# Patient Record
Sex: Female | Born: 2000 | Race: Black or African American | Hispanic: No | Marital: Single | Smoking: Never smoker
Health system: Southern US, Community
[De-identification: ages and names within clinical notes are randomized; demographics above are authoritative.]

## PROBLEM LIST (undated history)

## (undated) DIAGNOSIS — J45909 Unspecified asthma, uncomplicated: Secondary | ICD-10-CM

## (undated) HISTORY — PX: NO PAST SURGERIES: SHX2092

## (undated) HISTORY — DX: Unspecified asthma, uncomplicated: J45.909

---
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
## 2021-11-27 ENCOUNTER — Encounter (HOSPITAL_BASED_OUTPATIENT_CLINIC_OR_DEPARTMENT_OTHER): Payer: Self-pay

## 2021-11-27 ENCOUNTER — Emergency Department (HOSPITAL_BASED_OUTPATIENT_CLINIC_OR_DEPARTMENT_OTHER)
Admission: EM | Admit: 2021-11-27 | Discharge: 2021-11-27 | Disposition: A | Discharge status: Home / Self Care | Payer: BC Managed Care – PPO | Attending: Emergency Medicine | Admitting: Emergency Medicine

## 2021-11-27 ENCOUNTER — Other Ambulatory Visit: Payer: Self-pay

## 2021-11-27 DIAGNOSIS — N898 Other specified noninflammatory disorders of vagina: Secondary | ICD-10-CM | POA: Insufficient documentation

## 2021-11-27 DIAGNOSIS — N760 Acute vaginitis: Secondary | ICD-10-CM | POA: Diagnosis not present

## 2021-11-27 DIAGNOSIS — N3000 Acute cystitis without hematuria: Secondary | ICD-10-CM | POA: Diagnosis not present

## 2021-11-27 DIAGNOSIS — R1084 Generalized abdominal pain: Secondary | ICD-10-CM | POA: Insufficient documentation

## 2021-11-27 DIAGNOSIS — B9689 Other specified bacterial agents as the cause of diseases classified elsewhere: Secondary | ICD-10-CM | POA: Diagnosis not present

## 2021-11-27 DIAGNOSIS — R112 Nausea with vomiting, unspecified: Secondary | ICD-10-CM | POA: Insufficient documentation

## 2021-11-27 LAB — URINALYSIS, ROUTINE W REFLEX MICROSCOPIC
Bilirubin Urine: NEGATIVE
Glucose, UA: NEGATIVE mg/dL
Hgb urine dipstick: NEGATIVE
Ketones, ur: 15 mg/dL — AB
Nitrite: NEGATIVE
Protein, ur: 30 mg/dL — AB
Specific Gravity, Urine: 1.02 (ref 1.005–1.030)
pH: 8.5 — ABNORMAL HIGH (ref 5.0–8.0)

## 2021-11-27 LAB — CBC WITH DIFFERENTIAL/PLATELET
Abs Immature Granulocytes: 0.06 10*3/uL (ref 0.00–0.07)
Basophils Absolute: 0 10*3/uL (ref 0.0–0.1)
Basophils Relative: 0 %
Eosinophils Absolute: 0 10*3/uL (ref 0.0–0.5)
Eosinophils Relative: 0 %
HCT: 33.4 % — ABNORMAL LOW (ref 36.0–46.0)
Hemoglobin: 11 g/dL — ABNORMAL LOW (ref 12.0–15.0)
Immature Granulocytes: 1 %
Lymphocytes Relative: 9 %
Lymphs Abs: 0.7 10*3/uL (ref 0.7–4.0)
MCH: 29.9 pg (ref 26.0–34.0)
MCHC: 32.9 g/dL (ref 30.0–36.0)
MCV: 90.8 fL (ref 80.0–100.0)
Monocytes Absolute: 0.3 10*3/uL (ref 0.1–1.0)
Monocytes Relative: 4 %
Neutro Abs: 7.1 10*3/uL (ref 1.7–7.7)
Neutrophils Relative %: 86 %
Platelets: 612 10*3/uL — ABNORMAL HIGH (ref 150–400)
RBC: 3.68 MIL/uL — ABNORMAL LOW (ref 3.87–5.11)
RDW: 13.1 % (ref 11.5–15.5)
WBC: 8.2 10*3/uL (ref 4.0–10.5)
nRBC: 0 % (ref 0.0–0.2)

## 2021-11-27 LAB — COMPREHENSIVE METABOLIC PANEL
ALT: 22 U/L (ref 0–44)
AST: 29 U/L (ref 15–41)
Albumin: 3.9 g/dL (ref 3.5–5.0)
Alkaline Phosphatase: 49 U/L (ref 38–126)
Anion gap: 9 (ref 5–15)
BUN: 11 mg/dL (ref 6–20)
CO2: 27 mmol/L (ref 22–32)
Calcium: 9.4 mg/dL (ref 8.9–10.3)
Chloride: 102 mmol/L (ref 98–111)
Creatinine, Ser: 0.75 mg/dL (ref 0.44–1.00)
GFR, Estimated: >60 mL/min (ref >60)
Glucose, Bld: 115 mg/dL — ABNORMAL HIGH (ref 70–99)
Potassium: 3.8 mmol/L (ref 3.5–5.1)
Sodium: 138 mmol/L (ref 135–145)
Total Bilirubin: 0.4 mg/dL (ref 0.3–1.2)
Total Protein: 8.4 g/dL — ABNORMAL HIGH (ref 6.5–8.1)

## 2021-11-27 LAB — PREGNANCY, URINE: Preg Test, Ur: NEGATIVE

## 2021-11-27 LAB — WET PREP, GENITAL
Sperm: NONE SEEN
Trich, Wet Prep: NONE SEEN
WBC, Wet Prep HPF POC: <10 (ref <10)
Yeast Wet Prep HPF POC: NONE SEEN

## 2021-11-27 LAB — URINALYSIS, MICROSCOPIC (REFLEX)

## 2021-11-27 LAB — LIPASE, BLOOD: Lipase: 27 U/L (ref 11–51)

## 2021-11-27 MED ORDER — ONDANSETRON HCL 4 MG/2ML IJ SOLN
4.0000 mg | Freq: Once | INTRAMUSCULAR | Status: AC
  Administered 2021-11-27: 11:00:00 4 mg via INTRAVENOUS
  Filled 2021-11-27: qty 2

## 2021-11-27 MED ORDER — MORPHINE SULFATE (PF) 2 MG/ML IV SOLN
2.0000 mg | Freq: Once | INTRAVENOUS | Status: AC
  Administered 2021-11-27: 11:00:00 2 mg via INTRAVENOUS
  Filled 2021-11-27: qty 1

## 2021-11-27 MED ORDER — CEPHALEXIN 500 MG PO CAPS: 500.0000 mg | ORAL_CAPSULE | Freq: Two times a day (BID) | ORAL | 0 refills | Status: AC

## 2021-11-27 MED ORDER — METRONIDAZOLE 500 MG PO TABS: 500.0000 mg | ORAL_TABLET | Freq: Two times a day (BID) | ORAL | 0 refills | Status: DC

## 2021-11-27 MED ORDER — ONDANSETRON 4 MG PO TBDP: 4.0000 mg | ORAL_TABLET | Freq: Three times a day (TID) | ORAL | 0 refills | Status: DC | PRN

## 2021-11-27 MED ORDER — SODIUM CHLORIDE 0.9 % IV BOLUS
1000.0000 mL | Freq: Once | INTRAVENOUS | Status: AC
  Administered 2021-11-27: 11:00:00 1000 mL via INTRAVENOUS

## 2021-11-27 NOTE — ED Provider Notes (Signed)
[facility] EMERGENCY DEPARTMENT Provider Note   CSN: [id number] Arrival date & time: 11/27/21  [id number]     History Chief Complaint  Patient presents with   Abdominal Pain    Carla Andrews is a 20 y.o. female who presents to the ED today with complaint of gradual onset, constant, right upper quadrant abdominal pain radiating into epigastrium for the past 2 weeks.  She states that last night he began vomiting which was new for her.  She states she has multiple episodes of emesis.  She states the pain is not radiating into her epigastric area.  She last had a bowel movement a couple of days ago however states that before that she had a bowel movement about a week ago which is atypical for her to go so long.  She denies any recent sick contacts.  She denies any suspicious food intake.  She has never had this pain in the past.  She also complains of vaginal discharge and foul odor.  She reports history of BV and states this feels similar.  She is sexually active with 1 female partner and they "sometimes" use protection.  Last normal menstrual cycle on 11/30.  She denies any pelvic pain.   The history is provided by the patient and medical records.      History reviewed. No pertinent past medical history.  There are no problems to display for this patient.   History reviewed. No pertinent surgical history.   OB History   No obstetric history on file.     No family history on file.  Social History   Tobacco Use   Smoking status: Never   Smokeless tobacco: Never  Vaping Use   Vaping Use: Every day  Substance Use Topics   Alcohol use: Yes    Comment: socially   Drug use: Yes    Types: Marijuana    Home Medications Prior to Admission medications   Medication Sig Start Date End Date Taking? Authorizing Provider  cephALEXin (KEFLEX) 500 MG capsule Take 1 capsule (500 mg total) by mouth 2 (two) times daily for 5 days. 11/27/21 12/02/21 Yes Jonisha Kindig, PA-C   metroNIDAZOLE (FLAGYL) 500 MG tablet Take 1 tablet (500 mg total) by mouth 2 (two) times daily. 11/27/21  Yes Vantasia Pinkney, PA-C  ondansetron (ZOFRAN-ODT) 4 MG disintegrating tablet Take 1 tablet (4 mg total) by mouth every 8 (eight) hours as needed for nausea or vomiting. 11/27/21  Yes Eustaquio Maize, PA-C    Allergies    Patient has no known allergies.  Review of Systems   Review of Systems  Constitutional:  Negative for chills and fever.  Gastrointestinal:  Positive for abdominal pain, constipation, nausea and vomiting.  Genitourinary:  Positive for vaginal discharge. Negative for pelvic pain and vaginal bleeding.  All other systems reviewed and are negative.  Physical Exam Updated Vital Signs BP 110/66    Pulse 68    Temp 98.4 F (36.9 C) (Oral)    Resp 18    Ht 5\' 6"  (1.676 m)    Wt 68 kg    LMP 11/06/2021    SpO2 100%    BMI 24.21 kg/m   Physical Exam Vitals and nursing note reviewed.  Constitutional:      Appearance: She is not ill-appearing or diaphoretic.  HENT:     Head: Normocephalic and atraumatic.  Eyes:     Conjunctiva/sclera: Conjunctivae normal.  Cardiovascular:     Rate and Rhythm: Normal rate and regular  rhythm.  Pulmonary:     Effort: Pulmonary effort is normal.     Breath sounds: Normal breath sounds. No wheezing, rhonchi or rales.  Abdominal:     Palpations: Abdomen is soft.     Tenderness: There is abdominal tenderness in the epigastric area. There is no right CVA tenderness, left CVA tenderness, guarding or rebound.  Musculoskeletal:     Cervical back: Neck supple.  Skin:    General: Skin is warm and dry.  Neurological:     Mental Status: She is alert.    ED Results / Procedures / Treatments   Labs (all labs ordered are listed, but only abnormal results are displayed) Labs Reviewed  WET PREP, GENITAL - Abnormal; Notable for the following components:      Result Value   Clue Cells Wet Prep HPF POC PRESENT (*)    All other components  within normal limits  CBC WITH DIFFERENTIAL/PLATELET - Abnormal; Notable for the following components:   RBC 3.68 (*)    Hemoglobin 11.0 (*)    HCT 33.4 (*)    Platelets 612 (*)    All other components within normal limits  COMPREHENSIVE METABOLIC PANEL - Abnormal; Notable for the following components:   Glucose, Bld 115 (*)    Total Protein 8.4 (*)    All other components within normal limits  URINALYSIS, ROUTINE W REFLEX MICROSCOPIC - Abnormal; Notable for the following components:   pH 8.5 (*)    Ketones, ur 15 (*)    Protein, ur 30 (*)    Leukocytes,Ua TRACE (*)    All other components within normal limits  URINALYSIS, MICROSCOPIC (REFLEX) - Abnormal; Notable for the following components:   Bacteria, UA FEW (*)    Non Squamous Epithelial PRESENT (*)    All other components within normal limits  URINE CULTURE  LIPASE, BLOOD  PREGNANCY, URINE  GC/CHLAMYDIA PROBE AMP ([place]) NOT AT [facility]    EKG None  Radiology No results found.  Procedures Procedures   Medications Ordered in ED Medications  sodium chloride 0.9 % bolus 1,000 mL (0 mLs Intravenous Stopped 11/27/21 1225)  ondansetron (ZOFRAN) injection 4 mg (4 mg Intravenous Given 11/27/21 1058)  morphine 2 MG/ML injection 2 mg (2 mg Intravenous Given 11/27/21 1058)    ED Course  I have reviewed the triage vital signs and the nursing notes.  Pertinent labs & imaging results that were available during my care of the patient were reviewed by me and considered in my medical decision making (see chart for details).  Clinical Course as of 11/27/21 1400  Wed Nov 27, 2021  1314 Clue Cells Wet Prep HPF POC(!): PRESENT [MV]    Clinical Course User Index [MV] Tanda Rockers, PA-C   MDM Rules/Calculators/A&P                          20 year old female who presents to the ED today with complaint of right upper quadrant/epigastric abdominal pain for the past 2 weeks with associated nausea, vomiting that began  yesterday as well as some mild constipation and vaginal discharge.  On arrival to the vitals are stable.  Patient appears to be no acute distress.  He is noted to have epigastric abdominal tenderness palpation on exam today without rebound or guarding.  No lower abdominal tenderness palpation.  She denies any pelvic pain.  We will plan for abdominal screening labs and have patient self swab at this time to check  for BV and gonorrhea/chlamydia.  Given that she is not complaining of any pelvic pain I have lower suspicion for PID, tubo-ovarian abscess, ovarian torsion.  Do suspect viral illness at this time however we will continue to monitor with labs.  Given right upper quadrant pain question gallbladder etiology however patient is a thin young female and I feel this would be atypical.  If LFTs are abnormal may consider right upper quadrant ultrasound however lower suspicion for same.   CBC without leukocytosis.  Hemoglobin stable of 11.0. CMP with glucose 115.  No other electrolyte abnormalities Lipase within normal is a 27. Wet prep positive for clue cells.  Will treat for BV. Urinalysis with trace leuks, 21-50 white blood cells, few bacteria, 11-20 squamous epithelial as well as non-squamous epithelium present.  We will treat for UTI at this time. UPT negative  On reevaluation patient has been able to tolerate fluids without difficulty.  Will discharge home with Keflex for UTI and Flagyl for BV as well as Zofran as needed for nausea.  Patient instructed to have her urine rechecked in 1 to 2 weeks to ensure she has cleared the infection.  She is also instructed to await intercourse until she hears about her gonorrhea and Chlamydia test results.  She is in agreement with plan and stable for discharge.   This note was prepared using Dragon voice recognition software and may include unintentional dictation errors due to the inherent limitations of voice recognition software.     Final Clinical  Impression(s) / ED Diagnoses Final diagnoses:  Generalized abdominal pain  Nausea and vomiting, unspecified vomiting type  Bacterial vaginosis  Acute cystitis without hematuria    Rx / DC Orders ED Discharge Orders          Ordered    cephALEXin (KEFLEX) 500 MG capsule  2 times daily        11/27/21 1358    ondansetron (ZOFRAN-ODT) 4 MG disintegrating tablet  Every 8 hours PRN        11/27/21 1358    metroNIDAZOLE (FLAGYL) 500 MG tablet  2 times daily        11/27/21 1358             Discharge Instructions      Please pick up medications and take as prescribed for your urinary tract infection and bacterial vaginosis.   Please await your gonorrhea/chlamydia test results. We will call you if you test positive however you can log into MyChart and check the results that way. Do not have intercourse until you receive your results.   You will need to have your urine rechecked in 1-2 weeks to ensure you have cleared the infection.   Follow up with your PCP for further eval  Return to the ED for any new/worsening symptoms       Eustaquio Maize, PA-C 11/27/21 1400    Lennice Sites, DO 11/27/21 1503

## 2021-11-27 NOTE — ED Triage Notes (Signed)
Pt arrives with c/o vomiting starting last night and abdominal pain for 2 weeks. Pt reports pain is RUQ moving to epigastric area. Last BM 12/17.

## 2021-11-27 NOTE — ED Notes (Signed)
Attempted to collect urine sample, but pt unable to go at present

## 2021-11-27 NOTE — Discharge Instructions (Signed)
Please pick up medications and take as prescribed for your urinary tract infection and bacterial vaginosis.   Please await your gonorrhea/chlamydia test results. We will call you if you test positive however you can log into MyChart and check the results that way. Do not have intercourse until you receive your results.   You will need to have your urine rechecked in 1-2 weeks to ensure you have cleared the infection.   Follow up with your PCP for further eval  Return to the ED for any new/worsening symptoms

## 2021-11-27 NOTE — ED Notes (Signed)
Pelvic Cart at Bedside. 

## 2021-11-28 LAB — URINE CULTURE: Culture: <10000 — AB

## 2021-11-28 LAB — GC/CHLAMYDIA PROBE AMP (~~LOC~~) NOT AT ARMC
Chlamydia: POSITIVE — AB
Comment: NEGATIVE
Comment: NORMAL
Neisseria Gonorrhea: NEGATIVE

## 2022-12-08 NOTE — L&D Delivery Note (Signed)
Delivery Note Pushed well with reassuring fetal heart rate pattern, except for intermittent small to moderate variable decels with pushes.   At 1:43 AM a viable and healthy female was delivered via Vaginal, Spontaneous (Presentation: Right Occiput Anterior).  APGAR: , ; weight  .   Placenta status: Spontaneous, Intact.  Cord: 3 vessels with the following complications: somewhat tight nuchal cord, delivered through.:   Anesthesia: Epidural Episiotomy: None Lacerations: Labial, right, shallow, extending just into vagina Suture Repair: 3.0 monocryl  Est. Blood Loss (mL):  117  Mom to postpartum.  Baby to Couplet care / Skin to Skin.  Wynelle Bourgeois 10/27/2023, 2:17 AM

## 2023-08-04 LAB — POCT URINE PREGNANCY: Preg Test, Ur: POSITIVE — AB

## 2023-08-19 ENCOUNTER — Encounter (HOSPITAL_BASED_OUTPATIENT_CLINIC_OR_DEPARTMENT_OTHER): Payer: Self-pay

## 2023-08-19 ENCOUNTER — Ambulatory Visit (INDEPENDENT_AMBULATORY_CARE_PROVIDER_SITE_OTHER): Payer: BC Managed Care – PPO | Admitting: Obstetrics & Gynecology

## 2023-08-19 ENCOUNTER — Emergency Department (HOSPITAL_BASED_OUTPATIENT_CLINIC_OR_DEPARTMENT_OTHER)
Admission: EM | Admit: 2023-08-19 | Discharge: 2023-08-19 | Disposition: A | Discharge status: Home / Self Care | Payer: BC Managed Care – PPO | Attending: Emergency Medicine | Admitting: Emergency Medicine

## 2023-08-19 ENCOUNTER — Encounter: Payer: Self-pay | Admitting: Obstetrics & Gynecology

## 2023-08-19 ENCOUNTER — Other Ambulatory Visit: Payer: Self-pay

## 2023-08-19 ENCOUNTER — Telehealth: Payer: Self-pay

## 2023-08-19 ENCOUNTER — Encounter: Payer: Self-pay | Admitting: Family Medicine

## 2023-08-19 VITALS — BP 99/60 | HR 89 | Wt 152.0 lb

## 2023-08-19 DIAGNOSIS — O0933 Supervision of pregnancy with insufficient antenatal care, third trimester: Secondary | ICD-10-CM

## 2023-08-19 DIAGNOSIS — Z3A32 32 weeks gestation of pregnancy: Secondary | ICD-10-CM

## 2023-08-19 DIAGNOSIS — O0930 Supervision of pregnancy with insufficient antenatal care, unspecified trimester: Secondary | ICD-10-CM

## 2023-08-19 DIAGNOSIS — O99013 Anemia complicating pregnancy, third trimester: Secondary | ICD-10-CM | POA: Diagnosis not present

## 2023-08-19 DIAGNOSIS — Z3400 Encounter for supervision of normal first pregnancy, unspecified trimester: Secondary | ICD-10-CM | POA: Insufficient documentation

## 2023-08-19 DIAGNOSIS — Z113 Encounter for screening for infections with a predominantly sexual mode of transmission: Secondary | ICD-10-CM | POA: Diagnosis not present

## 2023-08-19 DIAGNOSIS — Z3493 Encounter for supervision of normal pregnancy, unspecified, third trimester: Secondary | ICD-10-CM | POA: Diagnosis present

## 2023-08-19 NOTE — ED Provider Notes (Signed)
[facility] EMERGENCY DEPARTMENT AT [facility] Provider Note   CSN: [id number] Arrival date & time: 08/19/23  0957     History  Chief Complaint  Patient presents with   Possible Pregnancy    Carla Andrews is a 22 y.o. female.  Patient with no significant past medical history presents to the emergency department for evaluation of pregnancy.  Patient states that she found out that she was pregnant about 2 weeks ago when she went in to be evaluated for possible bacterial vaginosis at a clinic in [place].  She states that [facility] [place] [facility] and had an ultrasound performed showing a pregnancy that she reported was at 32 weeks.  She currently denies any medical complaints including vaginal bleeding or discharge, lower abdominal pain or cramping, back pain or cramping.  This is her first pregnancy.  She states that she has been having trouble finding follow-up care, prompting ED visit today.  She did not have any nausea or vomiting with her pregnancy.  She has felt fetal movement but initially thought this was gas and bloating.  She has irregular periods at baseline.  No history of diabetes.  Patient does drink alcohol and smoke cigarettes.       Home Medications Prior to Admission medications   Medication Sig Start Date End Date Taking? Authorizing Provider  metroNIDAZOLE (FLAGYL) 500 MG tablet Take 1 tablet (500 mg total) by mouth 2 (two) times daily. 11/27/21   Hyman Hopes, Margaux, PA-C  ondansetron (ZOFRAN-ODT) 4 MG disintegrating tablet Take 1 tablet (4 mg total) by mouth every 8 (eight) hours as needed for nausea or vomiting. 11/27/21   Tanda Rockers, PA-C      Allergies    Amoxicillin and Penicillins    Review of Systems   Review of Systems  Physical Exam Updated Vital Signs BP 104/73 (BP Location: Right Arm)   Pulse 81   Temp 98.5 F (36.9 C) (Oral)   Resp 16   Ht 5\' 6"  (1.676 m)   Wt 69.4 kg   LMP 06/19/2023   SpO2 100%    BMI 24.69 kg/m   Physical Exam Vitals and nursing note reviewed.  Constitutional:      General: She is not in acute distress.    Appearance: She is well-developed.  HENT:     Head: Normocephalic and atraumatic.     Right Ear: External ear normal.     Left Ear: External ear normal.     Nose: Nose normal.  Eyes:     Conjunctiva/sclera: Conjunctivae normal.  Cardiovascular:     Rate and Rhythm: Normal rate and regular rhythm.     Heart sounds: No murmur heard. Pulmonary:     Effort: No respiratory distress.     Breath sounds: No wheezing, rhonchi or rales.  Abdominal:     Palpations: Abdomen is soft.     Tenderness: There is no abdominal tenderness. There is no guarding or rebound.     Comments: Gravid uterus.  Fundal height approximately 32 weeks consistent with patient report.  Musculoskeletal:     Cervical back: Normal range of motion and neck supple.     Right lower leg: No edema.     Left lower leg: No edema.  Skin:    General: Skin is warm and dry.     Findings: No rash.  Neurological:     General: No focal deficit present.     Mental Status: She is alert.  Mental status is at baseline.     Motor: No weakness.  Psychiatric:        Mood and Affect: Mood normal.     ED Results / Procedures / Treatments   Labs (all labs ordered are listed, but only abnormal results are displayed) Labs Reviewed - No data to display  EKG None  Radiology No results found.  Procedures Procedures    Medications Ordered in ED Medications - No data to display  ED Course/ Medical Decision Making/ A&P    Patient seen and examined. History obtained directly from patient.   Labs/EKG: None   Imaging: None  Medications/Fluids: None  Most recent vital signs reviewed and are as follows: BP 104/73 (BP Location: Right Arm)   Pulse 81   Temp 98.5 F (36.9 C) (Oral)   Resp 16   Ht 5\' 6"  (1.676 m)   Wt 69.4 kg   LMP 06/19/2023   SpO2 100%   BMI 24.69 kg/m   Initial  impression: Bedside ultrasound shows fetal movement, heart rate 145-150  I spoke with Dr. Crissie Reese of Ob/Gyn who is looking into possibility patient may be being seen in the office here today.  12:12 PM Update from OB/GYN, they are continuing to look into the possibility of appointment.  Patient to be discharged.  Patient will be contacted through MyChart if appointment available, per Dr. Crissie Reese.  I talked to the patient about this and she is agreeable.  She does have another appointment scheduled in about a week as a backup.  Encouraged return if she develops pain, vaginal bleeding or discharge, new symptoms or other concerns.  Encouraged abstinence from smoking and alcohol.                                Medical Decision Making  Patient who is approximately [redacted] weeks gestational age, no complaints.  Here mainly for OB/GYN care, that we do not have available in the ED.  I have spoken with OB/GYN on-call to help get her appropriate follow-up.  There is a chance that she may be seen later this afternoon, but cannot ensure.  She does have an appointment scheduled in [place] in about a week.  The patient's vital signs, pertinent lab work and imaging were reviewed and interpreted as discussed in the ED course. Hospitalization was considered for further testing, treatments, or serial exams/observation. However as patient is well-appearing, has a stable exam, and reassuring studies today, I do not feel that they warrant admission at this time. This plan was discussed with the patient who verbalizes agreement and comfort with this plan and seems reliable and able to return to the Emergency Department with worsening or changing symptoms.          Final Clinical Impression(s) / ED Diagnoses Final diagnoses:  [redacted] weeks gestation of pregnancy    Rx / DC Orders ED Discharge Orders     None         Renne Crigler, PA-C 08/19/23 1214    Horton, Clabe Seal, DO 08/20/23 [phone]

## 2023-08-19 NOTE — Discharge Instructions (Addendum)
Please follow-up with OB/GYN for follow-up.  If you will be able to be seen this afternoon at the clinic here, you will receive a message from Dr. Crissie Reese through MyChart.  Otherwise keep the other appointment that you have.   Return with any worsening or new symptoms including vaginal bleeding or discharge, pain, new symptoms or other concerns.

## 2023-08-19 NOTE — Telephone Encounter (Signed)
Called patient to confirm New OB appointment for today.Unable to leave message due to full vm.  Carla Andrews l Carla Andrews, CMA

## 2023-08-19 NOTE — ED Notes (Signed)
Pt states that she was seen 2 weeks ago at a clinic in [facility] for [place] , was told she was preg then, states she vomited the first pill for the BV and did not take the other, she then went a clinic in [place] to have preg terminated but was told she was 32 weeks, pt here for check  denies any issues

## 2023-08-19 NOTE — ED Notes (Signed)
Went to give pt d/c instructions and pt was gone , tried to call pt and her voice mail is full

## 2023-08-19 NOTE — ED Notes (Addendum)
FHT 135 pt denies any cramping, n/v or bleeding states

## 2023-08-19 NOTE — Progress Notes (Signed)
Subjective:    Carla Andrews is being seen today for her first obstetrical visit. LMP: unknown likely Jan /Feb 2024.  She was not aware that she was pregnant and thought she had gas. This is not a planned pregnancy. She was seen at the clinic at school for presumed BV and was dx'd as pregnant. She went for a EAB and was discovered to be in her 3rd trimester. She is at [redacted]w[redacted]d gestation. Her obstetrical history is significant for smoker and pt reports +use of THC and ETOH in pregnancy when she was unaware of her pregnancy.  . Relationship with FOB:  unknown. He is aware of the pregnancy . She is in [place] school at [employer]. She reports that her family is aware of the pregnancy and will be supportive. She may have to change to [facility] for school. Patient does intend to breast feed. Pregnancy history fully reviewed.  Menstrual History: OB History     Gravida  1   Para      Term      Preterm      AB      Living         SAB      IAB      Ectopic      Multiple      Live Births              Patient's last menstrual period was 01/05/2023. Period Pattern: Regular Menstrual Flow: Moderate Menstrual Control: Maxi pad Dysmenorrhea: (!) Moderate Dysmenorrhea Symptoms: Cramping  The following portions of the patient's history were reviewed and updated as appropriate: allergies, current medications, past family history, past medical history, past social history, past surgical history, and problem list.  Review of Systems Pertinent items are noted in HPI.    Objective:  BP 99/60   Pulse 89   Wt 152 lb (68.9 kg)   LMP 01/05/2023   BMI 24.53 kg/m   General Appearance:    Alert, cooperative, no distress, appears stated age  Head:    Normocephalic, without obvious abnormality, atraumatic  Eyes:    conjunctiva/corneas clear, EOM's intact, both eyes  Ears:    Normal external ear canals, both ears  Nose:   Nares normal, septum midline, mucosa normal, no drainage    or sinus  tenderness  Throat:   Lips, mucosa, and tongue normal; teeth and gums normal  Neck:   Supple, symmetrical, trachea midline, no adenopathy;    thyroid:  no enlargement/tenderness/nodules  Back:     Symmetric, no curvature, ROM normal, no CVA tenderness  Lungs:     respirations unlabored  Chest Wall:    No tenderness or deformity   Heart:    Regular rate and rhythm  Breast Exam:    No tenderness, masses, or nipple abnormality  Abdomen:     Soft, non-tender, bowel sounds active all four quadrants,    no masses, no organomegaly FH 29cm  Genitalia:    Normal female without lesion, discharge or tenderness     Extremities:   Extremities normal, atraumatic, no cyanosis or edema  Pulses:   2+ and symmetric all extremities  Skin:   Skin color, texture, turgor normal, no rashes or lesions      Assessment:    Pregnancy at 32 and 2/7 weeks    Plan:    Initial labs drawn. Prenatal vitamins. Problem list reviewed and updated. AFP3 discussed: too late Role of ultrasound in pregnancy discussed; fetal survey: requested. Amniocentesis discussed:  not indicated. Follow up in 2 weeks. Pt lives in [place] and will cont care at [facility].   60% of 40 min visit spent on counseling and coordination of care.   Eden Rho L. Harraway-Smith, M.D., Evern Core

## 2023-08-19 NOTE — ED Triage Notes (Signed)
Pt presents to ED from home reporting she is [redacted] weeks pregnant. [facility] [place] and told her she is [redacted] weeks pregnant. Pt states she has had no prenatal care. Unknown LMP. Denies cramping, leaking of fluid, vaginal bleeding, or vaginal discharge.

## 2023-08-20 LAB — CBC/D/PLT+RPR+RH+ABO+RUBIGG...
Antibody Screen: NEGATIVE
Basophils Absolute: 0 10*3/uL (ref 0.0–0.2)
Basos: 0 %
EOS (ABSOLUTE): 0.2 10*3/uL (ref 0.0–0.4)
Eos: 2 %
HCV Ab: NONREACTIVE
HIV Screen 4th Generation wRfx: NONREACTIVE
Hematocrit: 27.8 % — ABNORMAL LOW (ref 34.0–46.6)
Hemoglobin: 9.3 g/dL — ABNORMAL LOW (ref 11.1–15.9)
Hepatitis B Surface Ag: NEGATIVE
Immature Grans (Abs): 0.1 10*3/uL (ref 0.0–0.1)
Immature Granulocytes: 2 %
Lymphocytes Absolute: 1.4 10*3/uL (ref 0.7–3.1)
Lymphs: 17 %
MCH: 30.1 pg (ref 26.6–33.0)
MCHC: 33.5 g/dL (ref 31.5–35.7)
MCV: 90 fL (ref 79–97)
Monocytes Absolute: 0.7 10*3/uL (ref 0.1–0.9)
Monocytes: 9 %
Neutrophils Absolute: 5.8 10*3/uL (ref 1.4–7.0)
Neutrophils: 70 %
Platelets: 305 10*3/uL (ref 150–450)
RBC: 3.09 x10E6/uL — ABNORMAL LOW (ref 3.77–5.28)
RDW: 12.2 % (ref 11.7–15.4)
RPR Ser Ql: NONREACTIVE
Rh Factor: POSITIVE
Rubella Antibodies, IGG: 1.25 {index} (ref >0.99)
WBC: 8.2 10*3/uL (ref 3.4–10.8)

## 2023-08-20 LAB — HCV INTERPRETATION

## 2023-08-21 LAB — CULTURE, OB URINE

## 2023-08-21 LAB — URINE CULTURE, OB REFLEX

## 2023-09-02 ENCOUNTER — Ambulatory Visit (INDEPENDENT_AMBULATORY_CARE_PROVIDER_SITE_OTHER): Payer: Medicaid Other | Admitting: Family Medicine

## 2023-09-02 ENCOUNTER — Telehealth: Payer: BC Managed Care – PPO

## 2023-09-02 VITALS — BP 96/61 | HR 89 | Wt 153.0 lb

## 2023-09-02 DIAGNOSIS — O99013 Anemia complicating pregnancy, third trimester: Secondary | ICD-10-CM | POA: Insufficient documentation

## 2023-09-02 DIAGNOSIS — Z3400 Encounter for supervision of normal first pregnancy, unspecified trimester: Secondary | ICD-10-CM

## 2023-09-02 DIAGNOSIS — O0933 Supervision of pregnancy with insufficient antenatal care, third trimester: Secondary | ICD-10-CM

## 2023-09-02 NOTE — Progress Notes (Signed)
Pt declined flu shot and tdap

## 2023-09-02 NOTE — Progress Notes (Signed)
   PRENATAL VISIT NOTE  Subjective:  Carla Andrews is a 22 y.o. G1P0 at [redacted]w[redacted]d being seen today for ongoing prenatal care.  She is currently monitored for the following issues for this high-risk pregnancy and has Supervision of normal first pregnancy, antepartum; Late prenatal care affecting pregnancy; and Anemia, antepartum, third trimester on their problem list.  Patient reports no complaints.  Contractions: Not present. Vag. Bleeding: None.  Movement: Present. Denies leaking of fluid.   The following portions of the patient's history were reviewed and updated as appropriate: allergies, current medications, past family history, past medical history, past social history, past surgical history and problem list.   Objective:   Vitals:   09/02/23 0919  BP: 96/61  Pulse: 89  Weight: 153 lb (69.4 kg)    Fetal Status: Fetal Heart Rate (bpm): 140   Movement: Present     General:  Alert, oriented and cooperative. Patient is in no acute distress.  Skin: Skin is warm and dry. No rash noted.   Cardiovascular: Normal heart rate noted  Respiratory: Normal respiratory effort, no problems with respiration noted  Abdomen: Soft, gravid, appropriate for gestational age.  Pain/Pressure: Absent     Pelvic: Cervical exam deferred        Extremities: Normal range of motion.  Edema: None  Mental Status: Normal mood and affect. Normal behavior. Normal judgment and thought content.   Assessment and Plan:  Pregnancy: G1P0 at [redacted]w[redacted]d 1. Supervision of normal first pregnancy, antepartum FHT normal Check CBC - Glucose Tolerance, 2 Hours w/1 Hour - CBC - RPR - HIV antibody (with reflex)  2. Late prenatal care affecting pregnancy in third trimester    Preterm labor symptoms and general obstetric precautions including but not limited to vaginal bleeding, contractions, leaking of fluid and fetal movement were reviewed in detail with the patient. Please refer to After Visit Summary for other counseling  recommendations.   No follow-ups on file.  Future Appointments  Date Time Provider Department Center  09/08/2023  7:15 AM [facility] NURSE [facility]  09/08/2023  7:30 AM [facility] US1 [facility]  09/15/2023 10:15 AM Gerrit Heck, CNM [facility] None  09/21/2023  9:15 AM Adam Phenix, MD [facility] None  09/29/2023  9:35 AM Gerrit Heck, CNM [facility] None  10/05/2023  9:55 AM Adam Phenix, MD [facility] None    Levie Heritage, DO

## 2023-09-03 ENCOUNTER — Encounter: Payer: Self-pay | Admitting: Obstetrics and Gynecology

## 2023-09-03 LAB — RPR: RPR Ser Ql: NONREACTIVE

## 2023-09-03 LAB — GLUCOSE TOLERANCE, 2 HOURS W/ 1HR
Glucose, 1 hour: 79 mg/dL (ref 70–179)
Glucose, 2 hour: 98 mg/dL (ref 70–152)
Glucose, Fasting: 65 mg/dL — ABNORMAL LOW (ref 70–91)

## 2023-09-03 LAB — CBC
Hematocrit: 28.4 % — ABNORMAL LOW (ref 34.0–46.6)
Hemoglobin: 9 g/dL — ABNORMAL LOW (ref 11.1–15.9)
MCH: 29.6 pg (ref 26.6–33.0)
MCHC: 31.7 g/dL (ref 31.5–35.7)
MCV: 93 fL (ref 79–97)
Platelets: 274 10*3/uL (ref 150–450)
RBC: 3.04 x10E6/uL — ABNORMAL LOW (ref 3.77–5.28)
RDW: 12.4 % (ref 11.7–15.4)
WBC: 8.8 10*3/uL (ref 3.4–10.8)

## 2023-09-03 LAB — HIV ANTIBODY (ROUTINE TESTING W REFLEX): HIV Screen 4th Generation wRfx: NONREACTIVE

## 2023-09-07 ENCOUNTER — Other Ambulatory Visit: Payer: Self-pay | Admitting: Family Medicine

## 2023-09-07 ENCOUNTER — Telehealth: Payer: Self-pay

## 2023-09-07 NOTE — Telephone Encounter (Signed)
Auth Submission: NO AUTH NEEDED Site of care: Site of care: [facility] Payer: BCBS commercial and Medicaid [employer] Medication & CPT/J Code(s) submitted: Venofer (Iron Sucrose) J1756 Route of submission (phone, fax, portal):  Phone # Fax # Auth type: Buy/Bill PB Units/visits requested: 500mg  x 2 doses Reference number:  Approval from: 09/07/23 to 12/08/23

## 2023-09-08 ENCOUNTER — Other Ambulatory Visit: Payer: Self-pay | Admitting: *Deleted

## 2023-09-08 ENCOUNTER — Encounter: Payer: Self-pay | Admitting: Maternal & Fetal Medicine

## 2023-09-08 ENCOUNTER — Ambulatory Visit: Payer: BC Managed Care – PPO | Attending: Obstetrics & Gynecology

## 2023-09-08 ENCOUNTER — Ambulatory Visit: Payer: BC Managed Care – PPO | Admitting: *Deleted

## 2023-09-08 VITALS — BP 110/74 | HR 76

## 2023-09-08 DIAGNOSIS — O99013 Anemia complicating pregnancy, third trimester: Secondary | ICD-10-CM | POA: Diagnosis not present

## 2023-09-08 DIAGNOSIS — O0933 Supervision of pregnancy with insufficient antenatal care, third trimester: Secondary | ICD-10-CM

## 2023-09-08 DIAGNOSIS — O0930 Supervision of pregnancy with insufficient antenatal care, unspecified trimester: Secondary | ICD-10-CM | POA: Insufficient documentation

## 2023-09-08 DIAGNOSIS — D649 Anemia, unspecified: Secondary | ICD-10-CM | POA: Diagnosis not present

## 2023-09-08 DIAGNOSIS — Z3493 Encounter for supervision of normal pregnancy, unspecified, third trimester: Secondary | ICD-10-CM

## 2023-09-08 DIAGNOSIS — Z3400 Encounter for supervision of normal first pregnancy, unspecified trimester: Secondary | ICD-10-CM | POA: Insufficient documentation

## 2023-09-08 DIAGNOSIS — Z3A33 33 weeks gestation of pregnancy: Secondary | ICD-10-CM | POA: Diagnosis not present

## 2023-09-09 ENCOUNTER — Encounter: Payer: BC Managed Care – PPO | Admitting: Family Medicine

## 2023-09-15 ENCOUNTER — Ambulatory Visit (INDEPENDENT_AMBULATORY_CARE_PROVIDER_SITE_OTHER): Payer: Medicaid Other

## 2023-09-15 VITALS — BP 108/67 | HR 68 | Wt 156.0 lb

## 2023-09-15 DIAGNOSIS — Z3400 Encounter for supervision of normal first pregnancy, unspecified trimester: Secondary | ICD-10-CM

## 2023-09-15 DIAGNOSIS — Z3A34 34 weeks gestation of pregnancy: Secondary | ICD-10-CM

## 2023-09-15 DIAGNOSIS — O99013 Anemia complicating pregnancy, third trimester: Secondary | ICD-10-CM

## 2023-09-15 NOTE — Progress Notes (Signed)
    PRENATAL VISIT NOTE  Her Mom accompanies her today. Mom will be her support person in labor. Having a son. She would like to breast and bottlefeed. Has chosen a Optometrist.   Subjective:  Carla Andrews is a 22 y.o. G1P0 at [redacted]w[redacted]d being seen today for ongoing prenatal care.  She is currently monitored for the following issues for this low-risk pregnancy and has Supervision of normal first pregnancy, antepartum; Late prenatal care affecting pregnancy; and Anemia, antepartum, third trimester on their problem list.  Patient reports no bleeding, no contractions, no cramping, and no leaking.  Contractions: Not present. Vag. Bleeding: None.  Movement: Present. Denies leaking of fluid.   The following portions of the patient's history were reviewed and updated as appropriate: allergies, current medications, past family history, past medical history, past social history, past surgical history and problem list.   Objective:   Vitals:   09/15/23 1023  BP: 108/67  Pulse: 68  Weight: 156 lb (70.8 kg)    Fetal Status: Fetal Heart Rate (bpm): 132 Fundal Height: 35 cm Movement: Present  Presentation: Vertex  General:  Alert, oriented and cooperative. Patient is in no acute distress.  Skin: Skin is warm and dry. No rash noted.   Cardiovascular: Normal heart rate noted  Respiratory: Normal respiratory effort, no problems with respiration noted  Abdomen: Soft, gravid, appropriate for gestational age.  Pain/Pressure: Absent     Pelvic: Cervical exam deferred        Extremities: Normal range of motion.  Edema: None  Mental Status: Normal mood and affect. Normal behavior. Normal judgment and thought content.   Assessment and Plan:  Pregnancy: G1P0 at [redacted]w[redacted]d 1. [redacted] weeks gestation of pregnancy -Taking PNV and Iron supplementation  2. Supervision of normal first pregnancy, antepartum -Breastfeeding encouraged -Discussed availability epidural anesthesia if needed in labor  3. Anemia, antepartum,  third trimester -Continue Ferrous Sulfate  Preterm labor symptoms and general obstetric precautions including but not limited to vaginal bleeding, contractions, leaking of fluid and fetal movement were reviewed in detail with the patient. Please refer to After Visit Summary for other counseling recommendations.   RTO 1 week for ROB. Discussed GBS/GC/CT at 36 weeks.   Future Appointments  Date Time Provider Department Center  09/21/2023  9:15 AM Adam Phenix, MD [facility] None  09/29/2023  9:35 AM Gerrit Heck, CNM [facility] None  09/30/2023  7:30 AM [facility] US4 [facility]  10/05/2023  9:55 AM Adam Phenix, MD [facility] None    Letta Kocher, CNM

## 2023-09-15 NOTE — Patient Instructions (Addendum)
Breastfeeding Benefits In this video, you will more about breastfeeding and how it can benefit both you and your baby. To view the content, go to this web address: [email or web address]  This video will expire on: 05/31/2025. If you need access to this video following this date, please reach out to the healthcare provider who assigned it to you. This information is not intended to replace advice given to you by your health care provider. Make sure you discuss any questions you have with your health care provider.  Breastfeeding Your Newborn Breastfeeding can play an important role in your early moments and days with your new baby. This video will give you an overview of breastfeeding and provide helpful tips on how to get started. To view the content, go to this web address: [email or web address]  This video will expire on: 02/04/2025. If you need access to this video following this date, please reach out to the healthcare provider who assigned it to you. This information is not intended to replace advice given to you by your health care provider. Make sure you discuss any questions you have with your health care provider. Elsevier Patient Education  2024 [employer].  Elsevier Patient Education  2024 [employer].  Breastfeeding Tips for a Good Latch The latch is how your baby's mouth attaches to your nipple to breastfeed. A good latch will help your baby to feed and grow well. It will also help establish your milk supply. Your baby may have trouble latching for many reasons, including: Not being in the correct position on the breast. Problems within your baby's mouth, tongue, or lips. The shape of your nipples. Using a bottle or pacifier too early. Your baby being born early (prematurely). Small babies often have a weak suck reflex. Breasts becoming filled with too much milk. If this happens, express a little milk to help soften the breast. Work with a  breastfeeding specialist (Advertising copywriter) to help your baby have a good latch. How does this affect me? A poor latch may cause you to have problems such as: Cracked nipples. Sore nipples. Breasts becoming too filled with milk. Plugged milk ducts. Low milk supply. Breast inflammation. Breast infection. How does this affect my baby? A poor latch may cause your baby to not be able to feed well and have trouble gaining weight. What actions can I take to help my baby have a good latch? How to position your baby Find a comfortable place to sit or lie down. Your neck and back should be well supported. If you are seated, place a pillow or rolled-up blanket under your baby. This will bring your baby to the level of your breast. Make sure that your baby's belly is facing your belly. Try different positions to find one that works best for you and your baby. Massage To start, you might find it helpful to gently rub (massage) your breast. Move your fingertips in a circle as you massage from your chest wall toward your nipple. This helps milk flow. Keep doing this during feeding if needed. Position Position your breast for an ideal latch. Hold your breast with four fingers underneath and your thumb above your nipple. Keep your fingers away from your nipple and your baby's mouth. How to help your baby latch Follow these steps to help your baby latch: Rub your baby's lips gently with your finger or nipple. When your baby's mouth is open wide enough, quickly bring your baby to your breast and place your whole nipple into your  baby's mouth. Place as much of the colored area around your nipple (areola)as possible into your baby's mouth. Your baby's tongue should be between the lower gum and your breast. You should be able to see more areola above your baby's upper lip than below the lower lip. When your baby starts sucking, you will feel a gentle pull on your nipple. You should not feel any pain. Be  patient. It is common for a baby to suck for about 2-3 minutes to start the flow of breast milk. Make sure that your baby's mouth is in the right position around your nipple. Your baby's lips should make a seal on your breast and be turned outward.  Follow these instructions at home: General instructions Look for these signs that your baby has latched on to your nipple: The baby is quietly tugging or sucking without causing you pain. You hear the baby swallow after every 3 or 4 sucks. You see movement above and in front of the baby's ears while they are sucking. Know these signs that your baby has not latched on to your nipple: The baby makes sucking sounds or smacking sounds while feeding. You have nipple pain. If your baby is not latched well, put your little finger between your baby's gums and your nipple. This will break the seal. Then, help your baby latch again. Get help from a breastfeeding specialist if you follow the tips for a good latch but need more help. Where to find more information Office on Women's Health Breastfeeding: TravelLesson.ca Centers for Disease Control and Prevention: TonerPromos.no [employer] League: llli.org Contact a doctor if: You are frustrated with breastfeeding. You are worried your baby is not getting enough milk. Signs your baby is not getting enough milk include: Your baby is not gaining weight. Your baby loses weight. Your baby is more than 53 week old and wetting fewer than 6 diapers in a 24-hour period. You have pain in your breasts or feel uncomfortable. This may include: Continued pain with breastfeeding. Your breasts are too filled with milk, and this lasts longer than 48-72 hours. Cracking or soreness in your nipples that lasts longer than 1 week. Bleeding from your nipples. Your baby is often too sleepy to feed well. Your baby is crying and will not stop. You have signs of an infection, such as: A fever. Pus-like fluid coming from your  nipple. Warm, red areas on your breasts. This information is not intended to replace advice given to you by your health care provider. Make sure you discuss any questions you have with your health care provider. Document Revised: 01/07/2023 Document Reviewed: 12/11/2022 Elsevier Patient Education  2024 [employer].

## 2023-09-21 ENCOUNTER — Ambulatory Visit (INDEPENDENT_AMBULATORY_CARE_PROVIDER_SITE_OTHER): Payer: Medicaid Other | Admitting: Obstetrics & Gynecology

## 2023-09-21 VITALS — BP 105/69 | HR 86 | Wt 156.0 lb

## 2023-09-21 DIAGNOSIS — O99013 Anemia complicating pregnancy, third trimester: Secondary | ICD-10-CM

## 2023-09-21 DIAGNOSIS — O0933 Supervision of pregnancy with insufficient antenatal care, third trimester: Secondary | ICD-10-CM

## 2023-09-21 DIAGNOSIS — Z3400 Encounter for supervision of normal first pregnancy, unspecified trimester: Secondary | ICD-10-CM

## 2023-09-21 DIAGNOSIS — Z3A35 35 weeks gestation of pregnancy: Secondary | ICD-10-CM

## 2023-09-21 NOTE — Progress Notes (Signed)
   PRENATAL VISIT NOTE  Subjective:  Carla Andrews is a 22 y.o. G1P0 at [redacted]w[redacted]d being seen today for ongoing prenatal care.  She is currently monitored for the following issues for this low-risk pregnancy and has Supervision of normal first pregnancy, antepartum; Late prenatal care affecting pregnancy; and Anemia, antepartum, third trimester on their problem list.  Patient reports no complaints.  Contractions: Not present. Vag. Bleeding: None.  Movement: Present. Denies leaking of fluid.   The following portions of the patient's history were reviewed and updated as appropriate: allergies, current medications, past family history, past medical history, past social history, past surgical history and problem list.   Objective:   Vitals:   09/21/23 0913  BP: 105/69  Pulse: 86  Weight: 156 lb (70.8 kg)    Fetal Status: Fetal Heart Rate (bpm): 136   Movement: Present     General:  Alert, oriented and cooperative. Patient is in no acute distress.  Skin: Skin is warm and dry. No rash noted.   Cardiovascular: Normal heart rate noted  Respiratory: Normal respiratory effort, no problems with respiration noted  Abdomen: Soft, gravid, appropriate for gestational age.  Pain/Pressure: Present     Pelvic: Cervical exam deferred        Extremities: Normal range of motion.  Edema: None  Mental Status: Normal mood and affect. Normal behavior. Normal judgment and thought content.   Assessment and Plan:  Pregnancy: G1P0 at [redacted]w[redacted]d 1. [redacted] weeks gestation of pregnancy   2. Late prenatal care affecting pregnancy in third trimester   3. Supervision of normal first pregnancy, antepartum   4. Anemia, antepartum, third trimester Scheduled for iron infusion  Preterm labor symptoms and general obstetric precautions including but not limited to vaginal bleeding, contractions, leaking of fluid and fetal movement were reviewed in detail with the patient. Please refer to After Visit Summary for other  counseling recommendations.   Return in about 1 week (around 09/28/2023).  Future Appointments  Date Time Provider Department Center  09/24/2023  9:00 AM [facility] None  09/29/2023  9:35 AM Gerrit Heck, CNM [facility] None  09/30/2023  7:30 AM [facility]  10/05/2023  9:55 AM Adam Phenix, MD [facility] None  10/08/2023  9:15 AM [facility] None    Scheryl Darter, MD

## 2023-09-23 ENCOUNTER — Inpatient Hospital Stay (HOSPITAL_COMMUNITY)
Admission: AD | Admit: 2023-09-23 | Discharge: 2023-09-23 | Disposition: A | Discharge status: Home / Self Care | Payer: BC Managed Care – PPO | Attending: Obstetrics & Gynecology | Admitting: Obstetrics & Gynecology

## 2023-09-23 ENCOUNTER — Encounter (HOSPITAL_COMMUNITY): Payer: Self-pay | Admitting: Obstetrics & Gynecology

## 2023-09-23 DIAGNOSIS — O99613 Diseases of the digestive system complicating pregnancy, third trimester: Secondary | ICD-10-CM | POA: Diagnosis not present

## 2023-09-23 DIAGNOSIS — K5900 Constipation, unspecified: Secondary | ICD-10-CM | POA: Diagnosis not present

## 2023-09-23 DIAGNOSIS — O2243 Hemorrhoids in pregnancy, third trimester: Secondary | ICD-10-CM | POA: Diagnosis not present

## 2023-09-23 DIAGNOSIS — Z3A36 36 weeks gestation of pregnancy: Secondary | ICD-10-CM | POA: Diagnosis not present

## 2023-09-23 DIAGNOSIS — K649 Unspecified hemorrhoids: Secondary | ICD-10-CM

## 2023-09-23 DIAGNOSIS — K625 Hemorrhage of anus and rectum: Secondary | ICD-10-CM | POA: Diagnosis present

## 2023-09-23 LAB — URINALYSIS, ROUTINE W REFLEX MICROSCOPIC
Bilirubin Urine: NEGATIVE
Glucose, UA: NEGATIVE mg/dL
Hgb urine dipstick: NEGATIVE
Ketones, ur: 20 mg/dL — AB
Nitrite: NEGATIVE
Protein, ur: 30 mg/dL — AB
Specific Gravity, Urine: 1.023 (ref 1.005–1.030)
pH: 6 (ref 5.0–8.0)

## 2023-09-23 MED ORDER — SENNA 8.6 MG PO TABS: 2.0000 | ORAL_TABLET | Freq: Every evening | ORAL | 0 refills | Status: DC

## 2023-09-23 NOTE — MAU Provider Note (Addendum)
History    CSN: [id number]  Arrival date and time: 09/23/23 1250   Event Date/Time   First Provider Initiated Contact with Patient 09/23/23 1339      Chief Complaint  Patient presents with   Blood In Stools   HPI  22 y.o. female [redacted]w[redacted]d coming in with a chief complaint of blood in her stool. Pt states that this morning after a BM she noticed bright red blood in the toilet and was concerned. Pt states that she typically has a BM 1x every other week, and has been more constipated after beginning iron supplements. Pt described her BM this morning as hard and pellet like. Pt denies any further bleeding at the moment, rectal pain or itching, and abd pain or cramping. Pt is currently experiencing yellowish/white discharge due to BV but she reports she took her last dose of Metronidazole yesterday.  Pt denies VB or LOF or urinary symptoms; +FM OB History     Gravida  1   Para      Term      Preterm      AB      Living         SAB      IAB      Ectopic      Multiple      Live Births              Past Medical History:  Diagnosis Date   Asthma     Past Surgical History:  Procedure Laterality Date   NO PAST SURGERIES      Family History  Problem Relation Age of Onset   High blood pressure Mother    High Cholesterol Mother    Anemia Mother    Stroke Father    Anxiety disorder Father    Depression Father    High blood pressure Father    High Cholesterol Father    Cancer Maternal Aunt     Social History   Tobacco Use   Smoking status: Never   Smokeless tobacco: Never  Vaping Use   Vaping status: Former  Substance Use Topics   Alcohol use: Not Currently    Comment: socially   Drug use: Not Currently    Types: Marijuana    Comment: last use several months ag    Allergies:  Allergies  Allergen Reactions   Amoxicillin    Penicillins     Medications Prior to Admission  Medication Sig Dispense Refill Last Dose   ferrous sulfate 324 MG TBEC  Take 324 mg by mouth.   Past Week   metroNIDAZOLE (FLAGYL) 500 MG tablet Take 500 mg by mouth 2 (two) times daily.   09/22/2023   Prenatal Vit-Fe Fumarate-FA (MULTIVITAMIN-PRENATAL) 27-0.8 MG TABS tablet Take 1 tablet by mouth daily at 12 noon.   09/22/2023    Review of Systems  Constitutional:  Negative for appetite change and fever.  Gastrointestinal:  Positive for anal bleeding, blood in stool and constipation. Negative for abdominal pain, diarrhea and rectal pain.  Genitourinary:  Positive for vaginal discharge. Negative for pelvic pain and vaginal bleeding.   Physical Exam   Blood pressure 107/65, pulse 76, temperature 97.8 F (36.6 C), temperature source Oral, resp. rate 16, height 5\' 6"  (1.676 m), weight 69.9 kg, last menstrual period 01/05/2023, SpO2 100%.  Physical Exam Exam conducted with a chaperone present.  Constitutional:      Appearance: Normal appearance. She is normal weight.  HENT:     Head:  Normocephalic and atraumatic.     Nose: Nose normal.     Mouth/Throat:     Mouth: Mucous membranes are moist.  Eyes:     General: No scleral icterus.    Conjunctiva/sclera: Conjunctivae normal.  Cardiovascular:     Rate and Rhythm: Normal rate and regular rhythm.     Pulses: Normal pulses.  Pulmonary:     Effort: Pulmonary effort is normal.  Genitourinary:    Vagina: Vaginal discharge present.     Comments: Rectum: External hemorrhoid present with minimal bleeding. Musculoskeletal:        General: Normal range of motion.     Cervical back: Normal range of motion.  Skin:    General: Skin is warm and dry.  Neurological:     General: No focal deficit present.     Mental Status: She is alert and oriented to person, place, and time.  Psychiatric:        Mood and Affect: Mood normal.        Thought Content: Thought content normal.        Judgment: Judgment normal.    MAU Course  Procedures  I reviewed the patient's fetal monitoring. Reactive. Baseline HR:  130 Variability:  moderate Accels:present Decels: none Contractions: uterine irritability  A/P: Reactive fetal NST.  Reassured regarding fetal status.    MDM  Performed external vaginal exam to r/o vaginal bleeding. Speculum exam was deferred due to visualization of scant bleeding from an external hemorrhoid and none from vaginal opening. Counseled the pt on the benefits of stool softeners with constipation. Counseled the pt on prolonged constipation and it leading to hemorrhoids.  Assessment and Plan   1. Bleeding hemorrhoid   2. [redacted] weeks gestation of pregnancy   3. Constipation, unspecified constipation type     Plan:  -Discharge home -Start taking Senna, stool softener. Declines miralax 2/2 taste  Brien Mates, Student-PA 09/23/2023, 1:41 PM   Attestation of Supervision of Student:  I confirm that I have verified the information documented in the  PA students 's note and that I have also personally reperformed the history, physical exam and all medical decision making activities.  I have verified that all services and findings are accurately documented in this student's note; and I agree with management and plan as outlined in the documentation. I have also made any necessary editorial changes.  Joanne Gavel, MD OB Fellow 09/23/2023 2:18 PM

## 2023-09-23 NOTE — MAU Note (Signed)
.  Carla Andrews is a 22 y.o. at [redacted]w[redacted]d here in MAU reporting: she noticed blood in her stool after having a bowel movement earlier today. The blood was bright red in the toilet. She is unsure of if she has been constipated, but reports that she may have strained a little when trying to have a BM. She does not thick that the blood came from her vagina. She has also been having a yellowish, thick vaginal discharge - just completed meds for BV yesterday.  Denies pain, VB, or LOF. Reports +FM. LMP: N/A Onset of complaint: Today  Pain score: Denies pain Vitals:   09/23/23 1320  BP: 103/61  Pulse: 64  Resp: 16  Temp: 97.8 F (36.6 C)  SpO2: 100%     FHT:125 bpm Lab orders placed from triage:  UA

## 2023-09-24 ENCOUNTER — Ambulatory Visit: Payer: BC Managed Care – PPO | Admitting: *Deleted

## 2023-09-24 VITALS — BP 100/65 | HR 75 | Temp 98.7°F | Resp 18 | Ht 66.0 in | Wt 157.2 lb

## 2023-09-24 DIAGNOSIS — Z3A36 36 weeks gestation of pregnancy: Secondary | ICD-10-CM | POA: Diagnosis not present

## 2023-09-24 DIAGNOSIS — D508 Other iron deficiency anemias: Secondary | ICD-10-CM

## 2023-09-24 DIAGNOSIS — O99013 Anemia complicating pregnancy, third trimester: Secondary | ICD-10-CM

## 2023-09-24 MED ORDER — ACETAMINOPHEN 325 MG PO TABS
650.0000 mg | ORAL_TABLET | Freq: Once | ORAL | Status: AC
  Administered 2023-09-24: 650 mg via ORAL
  Filled 2023-09-24: qty 2

## 2023-09-24 MED ORDER — DIPHENHYDRAMINE HCL 25 MG PO CAPS
25.0000 mg | ORAL_CAPSULE | Freq: Once | ORAL | Status: AC
  Administered 2023-09-24: 25 mg via ORAL
  Filled 2023-09-24: qty 1

## 2023-09-24 MED ORDER — IRON SUCROSE 500 MG IVPB - SIMPLE MED
500.0000 mg | Freq: Once | INTRAVENOUS | Status: AC
  Administered 2023-09-24: 500 mg via INTRAVENOUS
  Filled 2023-09-24: qty 500

## 2023-09-24 NOTE — Patient Instructions (Signed)
Iron Sucrose Injection What is this medication? IRON SUCROSE (EYE ern SOO krose) treats low levels of iron (iron deficiency anemia) in people with kidney disease. Iron is a mineral that plays an important role in making red blood cells, which carry oxygen from your lungs to the rest of your body. This medicine may be used for other purposes; ask your health care provider or pharmacist if you have questions. COMMON BRAND NAME(S): Venofer What should I tell my care team before I take this medication? They need to know if you have any of these conditions: Anemia not caused by low iron levels Heart disease High levels of iron in the blood Kidney disease Liver disease An unusual or allergic reaction to iron, other medications, foods, dyes, or preservatives Pregnant or trying to get pregnant Breastfeeding How should I use this medication? This medication is for infusion into a vein. It is given in a hospital or clinic setting. Talk to your care team about the use of this medication in children. While this medication may be prescribed for children as young as 2 years for selected conditions, precautions do apply. Overdosage: If you think you have taken too much of this medicine contact a poison control center or emergency room at once. NOTE: This medicine is only for you. Do not share this medicine with others. What if I miss a dose? Keep appointments for follow-up doses. It is important not to miss your dose. Call your care team if you are unable to keep an appointment. What may interact with this medication? Do not take this medication with any of the following: Deferoxamine Dimercaprol Other iron products This medication may also interact with the following: Chloramphenicol Deferasirox This list may not describe all possible interactions. Give your health care provider a list of all the medicines, herbs, non-prescription drugs, or dietary supplements you use. Also tell them if you smoke,  drink alcohol, or use illegal drugs. Some items may interact with your medicine. What should I watch for while using this medication? Visit your care team regularly. Tell your care team if your symptoms do not start to get better or if they get worse. You may need blood work done while you are taking this medication. You may need to follow a special diet. Talk to your care team. Foods that contain iron include: whole grains/cereals, dried fruits, beans, or peas, leafy green vegetables, and organ meats (liver, kidney). What side effects may I notice from receiving this medication? Side effects that you should report to your care team as soon as possible: Allergic reactions--skin rash, itching, hives, swelling of the face, lips, tongue, or throat Low blood pressure--dizziness, feeling faint or lightheaded, blurry vision Shortness of breath Side effects that usually do not require medical attention (report to your care team if they continue or are bothersome): Flushing Headache Joint pain Muscle pain Nausea Pain, redness, or irritation at injection site This list may not describe all possible side effects. Call your doctor for medical advice about side effects. You may report side effects to FDA at 1-800-FDA-1088. Where should I keep my medication? This medication is given in a hospital or clinic. It will not be stored at home. NOTE: This sheet is a summary. It may not cover all possible information. If you have questions about this medicine, talk to your doctor, pharmacist, or health care provider.  2024 Elsevier/Gold Standard (2023-05-01 00:00:00)

## 2023-09-24 NOTE — Progress Notes (Signed)
Diagnosis: Iron Deficiency Anemia  Provider:  Chilton Greathouse MD  Procedure: IV Infusion  IV Type: Peripheral, IV Location: L Hand  Venofer (Iron Sucrose), Dose: 300 mg  Infusion Start Time: 0928 am  Infusion Stop Time: 1425 pm  Post Infusion IV Care: Observation period completed and Peripheral IV Discontinued  Discharge: Condition: Good, Destination: Home . AVS Provided  Performed by:  Forrest Moron, RN

## 2023-09-29 ENCOUNTER — Ambulatory Visit (INDEPENDENT_AMBULATORY_CARE_PROVIDER_SITE_OTHER): Payer: BC Managed Care – PPO

## 2023-09-29 ENCOUNTER — Other Ambulatory Visit (HOSPITAL_COMMUNITY)
Admission: RE | Admit: 2023-09-29 | Discharge: 2023-09-29 | Disposition: A | Discharge status: Home / Self Care | Payer: BC Managed Care – PPO | Source: Ambulatory Visit

## 2023-09-29 VITALS — BP 109/64 | HR 66 | Wt 158.0 lb

## 2023-09-29 DIAGNOSIS — Z3A36 36 weeks gestation of pregnancy: Secondary | ICD-10-CM | POA: Diagnosis present

## 2023-09-29 DIAGNOSIS — Z3403 Encounter for supervision of normal first pregnancy, third trimester: Secondary | ICD-10-CM | POA: Diagnosis present

## 2023-09-29 DIAGNOSIS — Z880 Allergy status to penicillin: Secondary | ICD-10-CM

## 2023-09-29 DIAGNOSIS — Z3400 Encounter for supervision of normal first pregnancy, unspecified trimester: Secondary | ICD-10-CM

## 2023-09-29 DIAGNOSIS — O98819 Other maternal infectious and parasitic diseases complicating pregnancy, unspecified trimester: Secondary | ICD-10-CM

## 2023-09-29 DIAGNOSIS — O99820 Streptococcus B carrier state complicating pregnancy: Secondary | ICD-10-CM | POA: Diagnosis not present

## 2023-09-29 DIAGNOSIS — B3731 Acute candidiasis of vulva and vagina: Secondary | ICD-10-CM

## 2023-09-29 MED ORDER — TERCONAZOLE 0.4 % VA CREA: 1.0000 | TOPICAL_CREAM | Freq: Every day | VAGINAL | 0 refills | Status: DC

## 2023-09-29 NOTE — Progress Notes (Unsigned)
   LOW-RISK PREGNANCY OFFICE VISIT  Patient name: Carla Andrews MRN [id number]  Date of birth: 2001/11/05 Chief Complaint:   Routine Prenatal Visit  Subjective:   Carla Andrews is a 22 y.o. G1P0 female at [redacted]w[redacted]d with an Estimated Date of Delivery: 10/21/23 being seen today for ongoing management of a low-risk pregnancy aeb has Supervision of normal first pregnancy, antepartum; Late prenatal care affecting pregnancy; and Anemia, antepartum, third trimester on their problem list.  Patient presents today, alone, with {sx:14538}.  Patient endorses fetal movement. Patient {Actions; denies-reports:120008} abdominal cramping or contractions.  Patient {Actions; denies-reports:120008} vaginal concerns including abnormal discharge, leaking of fluid, and bleeding. No issues with urination, constipation, or diarrhea.    Contractions: Irritability. Vag. Bleeding: None.  Movement: Present.  Reviewed past medical,surgical, social, obstetrical and family history as well as problem list, medications and allergies.  Objective   Vitals:   09/29/23 0952  BP: 109/64  Pulse: 66  Weight: 158 lb (71.7 kg)  Body mass index is 25.5 kg/m.  Total Weight Gain:12 lb (5.443 kg)         Physical Examination:   General appearance: Well appearing, and in no distress  Mental status: Alert, oriented to person, place, and time  Skin: Warm & dry  Cardiovascular: Normal heart rate noted  Respiratory: Normal respiratory effort, no distress  Abdomen: Soft, gravid, nontender, ***GA with    Pelvic: {Blank single:19197::"Cervical exam performed","Cervical exam deferred"}           Extremities: Edema: Trace  Fetal Status:    Movement: Present   No results found for this or any previous visit (from the past 24 hour(s)).  Assessment & Plan:  ***-risk pregnancy of a 22 y.o., G1P0 at [redacted]w[redacted]d with an Estimated Date of Delivery: 10/21/23   1. [redacted] weeks gestation of pregnancy ***      Meds: No orders of the defined types  were placed in this encounter.  Labs/procedures today:  Lab Orders         Culture, beta strep (group b only)      Reviewed: Term labor symptoms and general obstetric precautions including but not limited to vaginal bleeding, contractions, leaking of fluid and fetal movement were reviewed in detail with the patient.  All questions were answered.  Follow-up: No follow-ups on file.  Orders Placed This Encounter  Procedures   Culture, beta strep (group b only)   Cherre Robins MSN, CNM 09/29/2023

## 2023-09-30 ENCOUNTER — Other Ambulatory Visit: Payer: Self-pay

## 2023-09-30 ENCOUNTER — Ambulatory Visit: Payer: BC Managed Care – PPO | Attending: Maternal & Fetal Medicine

## 2023-09-30 DIAGNOSIS — D649 Anemia, unspecified: Secondary | ICD-10-CM

## 2023-09-30 DIAGNOSIS — Z362 Encounter for other antenatal screening follow-up: Secondary | ICD-10-CM | POA: Insufficient documentation

## 2023-09-30 DIAGNOSIS — O99013 Anemia complicating pregnancy, third trimester: Secondary | ICD-10-CM | POA: Diagnosis not present

## 2023-09-30 DIAGNOSIS — O0933 Supervision of pregnancy with insufficient antenatal care, third trimester: Secondary | ICD-10-CM | POA: Insufficient documentation

## 2023-09-30 DIAGNOSIS — Z3A37 37 weeks gestation of pregnancy: Secondary | ICD-10-CM | POA: Insufficient documentation

## 2023-09-30 DIAGNOSIS — O26843 Uterine size-date discrepancy, third trimester: Secondary | ICD-10-CM | POA: Diagnosis not present

## 2023-09-30 DIAGNOSIS — Z3493 Encounter for supervision of normal pregnancy, unspecified, third trimester: Secondary | ICD-10-CM | POA: Insufficient documentation

## 2023-09-30 LAB — GC/CHLAMYDIA PROBE AMP (~~LOC~~) NOT AT ARMC
Chlamydia: NEGATIVE
Comment: NEGATIVE
Comment: NORMAL
Neisseria Gonorrhea: NEGATIVE

## 2023-09-30 NOTE — Progress Notes (Signed)
   CIRCUMCISION CONSENT  Patient expresses desire for circumcision of anticipated female with EDD of 10/21/2023.  Informed that [facility] can perform said procedure and circumcision procedure details discussed.    -It was emphasized that this is an elective procedure.   -Risks and benefits of procedure were reviewed including, but not limited to:  *Benefits include reduction in the rates of urinary tract infection (UTI), penile cancer, some sexually transmitted infections, penile inflammatory, and retractile disorders, as well as easier hygiene.   *Risks include bleeding, infection, injury of glans which may lead to need for additional surgery, penile deformity, or urinary tract issues, unsatisfactory cosmetic appearance and other potential complications related to the procedure.   -Informed that procedure will not be performed if provider deems inappropriate d/t penile size, noted deformity, or unsatisfactory pediatric evaluation. -Circumcision to be done pending satisfactory pediatric evaluation of infant after delivery.  -Post circumcision care discussed. -Patient informed that circumcision can be refused despite prior consent. -Patient verbalizes understanding and without current questions or concerns.  -Information placed in AVS for patient review.   Cherre Robins MSN, CNM Advanced Practice Provider, Center for [facility] 09/29/2023 2:15 PM

## 2023-09-30 NOTE — Patient Instructions (Signed)
Deciding about Circumcision in Baby Boys  (The Basics)  What is circumcision?   Circumcision is a surgery that removes the skin that covers the tip of the penis, called the "foreskin" Circumcision is usually done when a boy is between 1 and 10 days old. In the [place], circumcision is common. In some other countries, fewer boys are circumcised. Circumcision is a common tradition in some religions.  Should I have my baby boy circumcised?   There is no easy answer. Circumcision has some benefits. But it also has risks. After talking with your doctor, you will have to decide for yourself what is right for your family.  What are the benefits of circumcision?   Circumcised boys seem to have slightly lower rates of: ?Urinary tract infections ?Swelling of the opening at the tip of the penis Circumcised men seem to have slightly lower rates of: ?Urinary tract infections ?Swelling of the opening at the tip of the penis ?Penis cancer ?HIV and other infections that you catch during sex ?Cervical cancer in the women they have sex with Even so, in the [place], the risks of these problems are small - even in boys and men who have not been circumcised. Plus, boys and men who are not circumcised can reduce these extra risks by: ?Cleaning their penis well ?Using condoms during sex  What are the risks of circumcision?  Risks include: ?Bleeding or infection from the surgery ?Damage to or amputation of the penis ?A chance that the doctor will cut off too much or not enough of the foreskin ?A chance that sex won't feel as good later in life Only about 1 out of every 200 circumcisions leads to problems. There is also a chance that your health insurance won't pay for circumcision.  How is circumcision done in baby boys?  First, the baby gets medicine for pain relief. This might be a cream on the skin or a shot into the base of the penis. Next, the doctor cleans the baby's penis well. Then  he or she uses special tools to cut off the foreskin. Finally, the doctor wraps a bandage (called gauze) around the baby's penis. If you have your baby circumcised, his doctor or nurse will give you instructions on how to care for him after the surgery. It is important that you follow those instructions carefully.  

## 2023-10-02 LAB — CULTURE, BETA STREP (GROUP B ONLY): Strep Gp B Culture: POSITIVE — AB

## 2023-10-03 DIAGNOSIS — B951 Streptococcus, group B, as the cause of diseases classified elsewhere: Secondary | ICD-10-CM | POA: Insufficient documentation

## 2023-10-05 ENCOUNTER — Ambulatory Visit (INDEPENDENT_AMBULATORY_CARE_PROVIDER_SITE_OTHER): Payer: Self-pay | Admitting: Obstetrics & Gynecology

## 2023-10-05 VITALS — BP 109/60 | HR 63 | Wt 160.0 lb

## 2023-10-05 DIAGNOSIS — Z3400 Encounter for supervision of normal first pregnancy, unspecified trimester: Secondary | ICD-10-CM

## 2023-10-05 DIAGNOSIS — B951 Streptococcus, group B, as the cause of diseases classified elsewhere: Secondary | ICD-10-CM

## 2023-10-05 NOTE — Progress Notes (Signed)
   PRENATAL VISIT NOTE  Subjective:  Carla Andrews is a 22 y.o. G1P0 at [redacted]w[redacted]d being seen today for ongoing prenatal care.  She is currently monitored for the following issues for this high-risk pregnancy and has Supervision of normal first pregnancy, antepartum; Late prenatal care affecting pregnancy; Anemia, antepartum, third trimester; and Group beta Strep positive on their problem list.  Patient reports no complaints.  Contractions: Not present. Vag. Bleeding: None.  Movement: Present. Denies leaking of fluid.   The following portions of the patient's history were reviewed and updated as appropriate: allergies, current medications, past family history, past medical history, past social history, past surgical history and problem list.   Objective:   Vitals:   10/05/23 0950  BP: 109/60  Pulse: 63  Weight: 160 lb (72.6 kg)    Fetal Status: Fetal Heart Rate (bpm): 143   Movement: Present     General:  Alert, oriented and cooperative. Patient is in no acute distress.  Skin: Skin is warm and dry. No rash noted.   Cardiovascular: Normal heart rate noted  Respiratory: Normal respiratory effort, no problems with respiration noted  Abdomen: Soft, gravid, appropriate for gestational age.  Pain/Pressure: Present     Pelvic: Cervical exam deferred        Extremities: Normal range of motion.  Edema: None  Mental Status: Normal mood and affect. Normal behavior. Normal judgment and thought content.   Assessment and Plan:  Pregnancy: G1P0 at [redacted]w[redacted]d 1. Supervision of normal first pregnancy, antepartum   2. Group beta Strep positive   Term labor symptoms and general obstetric precautions including but not limited to vaginal bleeding, contractions, leaking of fluid and fetal movement were reviewed in detail with the patient. Please refer to After Visit Summary for other counseling recommendations.   Return in about 1 week (around 10/12/2023).  Future Appointments  Date Time Provider  Department Center  10/08/2023  9:15 AM [facility] 8 [facility] None    Scheryl Darter, MD

## 2023-10-08 ENCOUNTER — Ambulatory Visit: Payer: BC Managed Care – PPO | Admitting: *Deleted

## 2023-10-08 VITALS — BP 100/67 | HR 68 | Temp 99.1°F | Resp 18 | Ht 66.0 in | Wt 161.0 lb

## 2023-10-08 DIAGNOSIS — O99013 Anemia complicating pregnancy, third trimester: Secondary | ICD-10-CM | POA: Diagnosis not present

## 2023-10-08 DIAGNOSIS — D508 Other iron deficiency anemias: Secondary | ICD-10-CM

## 2023-10-08 DIAGNOSIS — Z3A38 38 weeks gestation of pregnancy: Secondary | ICD-10-CM

## 2023-10-08 MED ORDER — IRON SUCROSE 500 MG IVPB - SIMPLE MED
500.0000 mg | Freq: Once | INTRAVENOUS | Status: AC
  Administered 2023-10-08: 500 mg via INTRAVENOUS
  Filled 2023-10-08: qty 275

## 2023-10-08 MED ORDER — ACETAMINOPHEN 325 MG PO TABS
650.0000 mg | ORAL_TABLET | Freq: Once | ORAL | Status: AC
  Administered 2023-10-08: 650 mg via ORAL
  Filled 2023-10-08: qty 2

## 2023-10-08 MED ORDER — DIPHENHYDRAMINE HCL 25 MG PO CAPS
25.0000 mg | ORAL_CAPSULE | Freq: Once | ORAL | Status: AC
  Administered 2023-10-08: 25 mg via ORAL
  Filled 2023-10-08: qty 1

## 2023-10-08 NOTE — Progress Notes (Signed)
Diagnosis: Iron Deficiency Anemia  Provider:  Chilton Greathouse MD  Procedure: IV Infusion  IV Type: Peripheral, IV Location: L Hand  Venofer (Iron Sucrose), Dose: 500 mg  Infusion Start Time: 0924 am  Infusion Stop Time: 1335 pm  Post Infusion IV Care: Observation period completed and Peripheral IV Discontinued  Discharge: Condition: Good, Destination: Home . AVS Provided  Performed by:  Forrest Moron, RN

## 2023-10-12 ENCOUNTER — Ambulatory Visit: Payer: BC Managed Care – PPO | Admitting: Obstetrics & Gynecology

## 2023-10-12 ENCOUNTER — Encounter: Payer: Self-pay | Admitting: Obstetrics & Gynecology

## 2023-10-12 VITALS — BP 94/58 | HR 66 | Wt 160.0 lb

## 2023-10-12 DIAGNOSIS — Z3403 Encounter for supervision of normal first pregnancy, third trimester: Secondary | ICD-10-CM | POA: Diagnosis not present

## 2023-10-12 DIAGNOSIS — Z1339 Encounter for screening examination for other mental health and behavioral disorders: Secondary | ICD-10-CM | POA: Diagnosis not present

## 2023-10-12 DIAGNOSIS — Z3A38 38 weeks gestation of pregnancy: Secondary | ICD-10-CM

## 2023-10-12 DIAGNOSIS — B951 Streptococcus, group B, as the cause of diseases classified elsewhere: Secondary | ICD-10-CM

## 2023-10-12 DIAGNOSIS — Z3400 Encounter for supervision of normal first pregnancy, unspecified trimester: Secondary | ICD-10-CM

## 2023-10-12 NOTE — Patient Instructions (Addendum)
Induction of labor scheduled midnight 10/28/23, come in at 11:45 pm on 10/27/23.   Return to office for any scheduled appointments. Call the office or go to the MAU at [facility] if: You begin to have strong, frequent contractions Your water breaks.  Sometimes it is a big gush of fluid, sometimes it is just a trickle that keeps getting your underwear wet or running down your legs You have vaginal bleeding.  It is normal to have a small amount of spotting if your cervix was checked.  You do not feel your baby moving like normal.  If you do not, get something to eat and drink and lay down and focus on feeling your baby move.   If your baby is still not moving like normal, you should call the office or go to MAU. Any other obstetric concerns.

## 2023-10-12 NOTE — Progress Notes (Signed)
   PRENATAL VISIT NOTE  Subjective:  Carla Andrews is a 22 y.o. G1P0 at [redacted]w[redacted]d being seen today for ongoing prenatal care.  Here with mother. She is currently monitored for the following issues for this low-risk pregnancy and has Supervision of normal first pregnancy, antepartum; Late prenatal care affecting pregnancy; Anemia, antepartum, third trimester; and Group beta Strep positive on their problem list.  Patient reports no complaints.  Contractions: Not present. Vag. Bleeding: None.  Movement: Present. Denies leaking of fluid.   The following portions of the patient's history were reviewed and updated as appropriate: allergies, current medications, past family history, past medical history, past social history, past surgical history and problem list.   Objective:   Vitals:   10/12/23 0936  BP: (!) 94/58  Pulse: 66  Weight: 160 lb (72.6 kg)    Fetal Status: Fetal Heart Rate (bpm): 141 Fundal Height: 38 cm Movement: Present     General:  Alert, oriented and cooperative. Patient is in no acute distress.  Skin: Skin is warm and dry. No rash noted.   Cardiovascular: Normal heart rate noted  Respiratory: Normal respiratory effort, no problems with respiration noted  Abdomen: Soft, gravid, appropriate for gestational age.  Pain/Pressure: Present     Pelvic: Cervical exam deferred        Extremities: Normal range of motion.  Edema: Trace  Mental Status: Normal mood and affect. Normal behavior. Normal judgment and thought content.   Assessment and Plan:  Pregnancy: G1P0 at [redacted]w[redacted]d 1. Group beta Strep positive Unknown allergy to PCN, has tolerated Keflex in past. Will treat with Ancef in labor.  2. [redacted] weeks gestation of pregnancy 3. Supervision of normal first pregnancy, antepartum Postdates IOL scheduled for 10/28/23 at midnight, orders signed and held. Postdates BPP ordered - US MFM FETAL BPP WO NON STRESS; Future  Term labor symptoms and general obstetric precautions including but  not limited to vaginal bleeding, contractions, leaking of fluid and fetal movement were reviewed in detail with the patient. Please refer to After Visit Summary for other counseling recommendations.   Return in about 1 week (around 10/19/2023) for OFFICE OB VISIT (MD only).  Future Appointments  Date Time Provider Department Center  10/20/2023  9:35 AM Gerrit Heck, CNM [facility] None  10/28/2023 12:00 AM [facility] SCHED ROOM [facility] None    Jaynie Collins, MD

## 2023-10-20 ENCOUNTER — Other Ambulatory Visit: Payer: Self-pay | Admitting: Advanced Practice Midwife

## 2023-10-20 ENCOUNTER — Ambulatory Visit (INDEPENDENT_AMBULATORY_CARE_PROVIDER_SITE_OTHER): Payer: BC Managed Care – PPO

## 2023-10-20 VITALS — BP 113/62 | HR 63 | Wt 162.0 lb

## 2023-10-20 DIAGNOSIS — B951 Streptococcus, group B, as the cause of diseases classified elsewhere: Secondary | ICD-10-CM | POA: Diagnosis not present

## 2023-10-20 DIAGNOSIS — Z3400 Encounter for supervision of normal first pregnancy, unspecified trimester: Secondary | ICD-10-CM | POA: Diagnosis not present

## 2023-10-20 DIAGNOSIS — Z3A39 39 weeks gestation of pregnancy: Secondary | ICD-10-CM | POA: Diagnosis not present

## 2023-10-20 NOTE — Progress Notes (Signed)
   LOW-RISK PREGNANCY OFFICE VISIT  Patient name: Carla Andrews MRN [id number]  Date of birth: 2000-12-29 Chief Complaint:   Routine Prenatal Visit  Subjective:   Carla Andrews is a 22 y.o. G1P0 female at [redacted]w[redacted]d with an Estimated Date of Delivery: 10/21/23 being seen today for ongoing management of a low-risk pregnancy aeb has Supervision of normal first pregnancy, antepartum; Late prenatal care affecting pregnancy; Anemia, antepartum, third trimester; and Group beta Strep positive on their problem list.  Patient presents today, with mom-Faye, with no complaints.  Patient endorses fetal movement. Patient denies abdominal cramping or contractions.  Patient denies vaginal concerns including abnormal discharge, leaking of fluid, and bleeding. No issues with urination, constipation, or diarrhea.    Contractions: Not present. Vag. Bleeding: None.  Movement: Present.  Reviewed past medical,surgical, social, obstetrical and family history as well as problem list, medications and allergies.  Objective   Vitals:   10/20/23 0927  BP: 113/62  Pulse: 63  Weight: 162 lb (73.5 kg)  Body mass index is 26.15 kg/m.  Total Weight Gain:16 lb (7.258 kg)         Physical Examination:   General appearance: Well appearing, and in no distress  Mental status: Alert, oriented to person, place, and time  Skin: Warm & dry  Cardiovascular: Normal heart rate noted  Respiratory: Normal respiratory effort, no distress  Abdomen: Soft, gravid, nontender, AGA with Fundal Height: 38 cm  Pelvic: Cervical exam performed  Dilation: 1 Effacement (%): 70 Station: -3 Presentation: Vertex  Extremities: Edema: None  Fetal Status: Fetal Heart Rate (bpm): 131  Movement: Present   No results found for this or any previous visit (from the past 24 hour(s)).  Assessment & Plan:  Low-risk pregnancy of a 22 y.o., G1P0 at [redacted]w[redacted]d with an Estimated Date of Delivery: 10/21/23   1. Supervision of normal first pregnancy,  antepartum -Anticipatory guidance for upcoming appts. -Patient to schedule next appt in 6-8 weeks for a postpartum visit.  2. [redacted] weeks gestation of pregnancy -Doing well. -Discussed IOL. Scheduled for 10/28/2023. -Reviewed risks and benefits of membrane stripping including: *Abdominal cramping, contractions, ROM, and vaginal bleeding.  *Discussed how this is considered induction method as it causes releasing of prostaglandins which can stimulate labor resulting in delivery.  *Cautioned that membrane stripping/sweeping is not guaranteed to cause labor onset. -Patient verbalized understanding and wishes to proceed with procedure.  -VE and membrane stripping performed; Patient tolerated well. -Encouraged to go home and ambulate.  3. Group beta Strep positive -Ancef while in labor.      Meds: No orders of the defined types were placed in this encounter.  Labs/procedures today:  Lab Orders  No laboratory test(s) ordered today     Reviewed: Term labor symptoms and general obstetric precautions including but not limited to vaginal bleeding, contractions, leaking of fluid and fetal movement were reviewed in detail with the patient.  All questions were answered.  Follow-up: No follow-ups on file.  No orders of the defined types were placed in this encounter.  Cherre Robins MSN, CNM 10/20/2023

## 2023-10-21 ENCOUNTER — Telehealth (HOSPITAL_COMMUNITY): Payer: Self-pay | Admitting: *Deleted

## 2023-10-21 ENCOUNTER — Encounter (HOSPITAL_COMMUNITY): Payer: Self-pay

## 2023-10-21 NOTE — Telephone Encounter (Signed)
Preadmission screen  

## 2023-10-22 ENCOUNTER — Encounter (HOSPITAL_COMMUNITY): Payer: Self-pay

## 2023-10-22 ENCOUNTER — Other Ambulatory Visit: Payer: Self-pay

## 2023-10-22 ENCOUNTER — Ambulatory Visit: Payer: BC Managed Care – PPO | Attending: Obstetrics & Gynecology

## 2023-10-22 ENCOUNTER — Telehealth (HOSPITAL_COMMUNITY): Payer: Self-pay | Admitting: *Deleted

## 2023-10-22 ENCOUNTER — Ambulatory Visit: Payer: BC Managed Care – PPO | Admitting: *Deleted

## 2023-10-22 VITALS — BP 110/58 | HR 58

## 2023-10-22 DIAGNOSIS — O480 Post-term pregnancy: Secondary | ICD-10-CM | POA: Insufficient documentation

## 2023-10-22 DIAGNOSIS — Z3400 Encounter for supervision of normal first pregnancy, unspecified trimester: Secondary | ICD-10-CM | POA: Diagnosis not present

## 2023-10-22 DIAGNOSIS — Z3A40 40 weeks gestation of pregnancy: Secondary | ICD-10-CM | POA: Diagnosis not present

## 2023-10-22 DIAGNOSIS — O99013 Anemia complicating pregnancy, third trimester: Secondary | ICD-10-CM | POA: Diagnosis not present

## 2023-10-22 DIAGNOSIS — O0933 Supervision of pregnancy with insufficient antenatal care, third trimester: Secondary | ICD-10-CM | POA: Insufficient documentation

## 2023-10-22 NOTE — Telephone Encounter (Signed)
Preadmission screen  

## 2023-10-23 ENCOUNTER — Telehealth (HOSPITAL_COMMUNITY): Payer: Self-pay | Admitting: *Deleted

## 2023-10-23 NOTE — Telephone Encounter (Signed)
Preadmission screen  

## 2023-10-24 ENCOUNTER — Inpatient Hospital Stay (EMERGENCY_DEPARTMENT_HOSPITAL)
Admission: AD | Admit: 2023-10-24 | Discharge: 2023-10-25 | Disposition: A | Discharge status: Home / Self Care | Payer: BC Managed Care – PPO | Source: Home / Self Care | Attending: Family Medicine | Admitting: Family Medicine

## 2023-10-24 ENCOUNTER — Encounter (HOSPITAL_COMMUNITY): Payer: Self-pay | Admitting: Family Medicine

## 2023-10-24 DIAGNOSIS — O471 False labor at or after 37 completed weeks of gestation: Secondary | ICD-10-CM | POA: Insufficient documentation

## 2023-10-24 DIAGNOSIS — O480 Post-term pregnancy: Secondary | ICD-10-CM | POA: Insufficient documentation

## 2023-10-24 DIAGNOSIS — Z3689 Encounter for other specified antenatal screening: Secondary | ICD-10-CM

## 2023-10-24 DIAGNOSIS — Z3A40 40 weeks gestation of pregnancy: Secondary | ICD-10-CM | POA: Insufficient documentation

## 2023-10-24 DIAGNOSIS — Z0371 Encounter for suspected problem with amniotic cavity and membrane ruled out: Secondary | ICD-10-CM

## 2023-10-24 NOTE — MAU Note (Signed)
.  Carla Andrews is a 22 y.o. at [redacted]w[redacted]d here in MAU reporting:   Ctxs every 3 min that started at 9pm. No Lof, No bleeding +FM.  Vitals:   10/24/23 2339  BP: 108/68  Pulse: (!) 55  Resp: 17  SpO2: 98%     Pain score: 2/10 FHT:120 Lab orders placed from triage:   labor eval.

## 2023-10-25 DIAGNOSIS — O471 False labor at or after 37 completed weeks of gestation: Secondary | ICD-10-CM

## 2023-10-25 DIAGNOSIS — Z3A40 40 weeks gestation of pregnancy: Secondary | ICD-10-CM | POA: Diagnosis not present

## 2023-10-25 LAB — POCT FERN TEST: POCT Fern Test: NEGATIVE

## 2023-10-25 NOTE — MAU Provider Note (Signed)
Event Date/Time   First Provider Initiated Contact with Patient 10/25/23 0027       S: Carla Andrews is a 22 y.o. G1P0 at [redacted]w[redacted]d  who presents to MAU today complaining contractions q 3 minutes since 2100. She denies vaginal bleeding. She denies LO, but expresses concern with "peeing without being able to stop it."  She states this occurred earlier and she has had no leaking since that incident. She reports normal fetal movement.    O: BP 108/68 (BP Location: Right Arm)   Pulse (!) 55   Temp 98.5 F (36.9 C)   Resp 17   Ht 5\' 6"  (1.676 m)   Wt 73.6 kg   LMP 01/05/2023   SpO2 98%   BMI 26.18 kg/m  GENERAL: Well-developed, well-nourished female in no acute distress.  HEAD: Normocephalic, atraumatic.  CHEST: Normal effort of breathing, regular heart rate ABDOMEN: Soft, nontender, gravid GU: Moderate amt thick white discharge at introitus.  Fern collected blindly.   Cervical exam:  Dilation: 1.5 Effacement (%): 70 Cervical Position: Middle Station: -3 Presentation: Vertex Exam by:: Felipa Furnace RN   Fetal Monitoring: Baseline: 125 Variability: Moderate Accelerations: Present Decelerations: None Contractions: palpates mild   A: SIUP at [redacted]w[redacted]d  False labor Cat I FT Membranes Intact  P: -Fern Negative -Discussed membrane stripping if no cervical change. Patient declines.  -NST reactive. -Monitor and reassess.   Gerrit Heck, CNM 10/25/2023    Reassessment (1:05 AM) -Nurse reports no cervical change. -NST remains reactive. -Patient scheduled for IOL for 11/20 -Precautions reviewed. -Patient questions and informed it is okay to take EPO for cervical ripening if desired.  -Discharged to home in stable condition.  Cherre Robins MSN, CNM Advanced Practice Provider, Center for [employer]

## 2023-10-26 ENCOUNTER — Encounter (HOSPITAL_COMMUNITY): Payer: Self-pay | Admitting: Family Medicine

## 2023-10-26 ENCOUNTER — Inpatient Hospital Stay (HOSPITAL_COMMUNITY): Payer: BC Managed Care – PPO | Admitting: Anesthesiology

## 2023-10-26 ENCOUNTER — Other Ambulatory Visit: Payer: Self-pay

## 2023-10-26 ENCOUNTER — Inpatient Hospital Stay (HOSPITAL_COMMUNITY)
Admission: AD | Admit: 2023-10-26 | Discharge: 2023-10-28 | DRG: 807 | Disposition: A | Discharge status: Home / Self Care | Payer: BC Managed Care – PPO | Attending: Obstetrics and Gynecology | Admitting: Obstetrics and Gynecology

## 2023-10-26 DIAGNOSIS — Z3A40 40 weeks gestation of pregnancy: Secondary | ICD-10-CM

## 2023-10-26 DIAGNOSIS — Z818 Family history of other mental and behavioral disorders: Secondary | ICD-10-CM

## 2023-10-26 DIAGNOSIS — Z8249 Family history of ischemic heart disease and other diseases of the circulatory system: Secondary | ICD-10-CM

## 2023-10-26 DIAGNOSIS — Z823 Family history of stroke: Secondary | ICD-10-CM | POA: Diagnosis not present

## 2023-10-26 DIAGNOSIS — O9902 Anemia complicating childbirth: Secondary | ICD-10-CM | POA: Diagnosis present

## 2023-10-26 DIAGNOSIS — O99820 Streptococcus B carrier state complicating pregnancy: Secondary | ICD-10-CM | POA: Diagnosis not present

## 2023-10-26 DIAGNOSIS — Z880 Allergy status to penicillin: Secondary | ICD-10-CM | POA: Diagnosis not present

## 2023-10-26 DIAGNOSIS — O99824 Streptococcus B carrier state complicating childbirth: Secondary | ICD-10-CM | POA: Diagnosis present

## 2023-10-26 DIAGNOSIS — Z3400 Encounter for supervision of normal first pregnancy, unspecified trimester: Secondary | ICD-10-CM

## 2023-10-26 DIAGNOSIS — Z83438 Family history of other disorder of lipoprotein metabolism and other lipidemia: Secondary | ICD-10-CM | POA: Diagnosis not present

## 2023-10-26 DIAGNOSIS — O0933 Supervision of pregnancy with insufficient antenatal care, third trimester: Secondary | ICD-10-CM | POA: Diagnosis not present

## 2023-10-26 DIAGNOSIS — O99324 Drug use complicating childbirth: Secondary | ICD-10-CM | POA: Diagnosis not present

## 2023-10-26 DIAGNOSIS — O480 Post-term pregnancy: Principal | ICD-10-CM | POA: Diagnosis present

## 2023-10-26 LAB — CBC
HCT: 37.3 % (ref 36.0–46.0)
Hemoglobin: 12.4 g/dL (ref 12.0–15.0)
MCH: 30.5 pg (ref 26.0–34.0)
MCHC: 33.2 g/dL (ref 30.0–36.0)
MCV: 91.9 fL (ref 80.0–100.0)
Platelets: 221 10*3/uL (ref 150–400)
RBC: 4.06 MIL/uL (ref 3.87–5.11)
RDW: 15.9 % — ABNORMAL HIGH (ref 11.5–15.5)
WBC: 6 10*3/uL (ref 4.0–10.5)
nRBC: 0 % (ref 0.0–0.2)

## 2023-10-26 LAB — TYPE AND SCREEN
ABO/RH(D): O POS
Antibody Screen: NEGATIVE

## 2023-10-26 LAB — RPR: RPR Ser Ql: NONREACTIVE

## 2023-10-26 MED ORDER — OXYCODONE-ACETAMINOPHEN 5-325 MG PO TABS: 1.0000 | ORAL_TABLET | ORAL | Status: DC | PRN

## 2023-10-26 MED ORDER — CEFAZOLIN SODIUM-DEXTROSE 1-4 GM/50ML-% IV SOLN
1.0000 g | Freq: Three times a day (TID) | INTRAVENOUS | Status: DC
  Administered 2023-10-26 (×2): 1 g via INTRAVENOUS
  Filled 2023-10-26 (×5): qty 50

## 2023-10-26 MED ORDER — TERBUTALINE SULFATE 1 MG/ML IJ SOLN: 0.2500 mg | Freq: Once | INTRAMUSCULAR | Status: DC | PRN

## 2023-10-26 MED ORDER — LIDOCAINE HCL (PF) 1 % IJ SOLN: 30.0000 mL | INTRAMUSCULAR | Status: DC | PRN

## 2023-10-26 MED ORDER — FENTANYL-BUPIVACAINE-NACL 0.5-0.125-0.9 MG/250ML-% EP SOLN
EPIDURAL | Status: AC
  Filled 2023-10-26: qty 250

## 2023-10-26 MED ORDER — OXYCODONE-ACETAMINOPHEN 5-325 MG PO TABS: 2.0000 | ORAL_TABLET | ORAL | Status: DC | PRN

## 2023-10-26 MED ORDER — ONDANSETRON HCL 4 MG/2ML IJ SOLN
4.0000 mg | Freq: Four times a day (QID) | INTRAMUSCULAR | Status: DC | PRN
  Administered 2023-10-26: 4 mg via INTRAVENOUS
  Filled 2023-10-26: qty 2

## 2023-10-26 MED ORDER — ACETAMINOPHEN 325 MG PO TABS: 650.0000 mg | ORAL_TABLET | ORAL | Status: DC | PRN

## 2023-10-26 MED ORDER — LACTATED RINGERS IV SOLN: INTRAVENOUS | Status: DC

## 2023-10-26 MED ORDER — LACTATED RINGERS IV SOLN: 500.0000 mL | Freq: Once | INTRAVENOUS | Status: DC

## 2023-10-26 MED ORDER — ZOLPIDEM TARTRATE 5 MG PO TABS
5.0000 mg | ORAL_TABLET | Freq: Every evening | ORAL | Status: DC | PRN
Start: 2023-10-26 — End: 2023-10-27

## 2023-10-26 MED ORDER — EPHEDRINE 5 MG/ML INJ: 10.0000 mg | INTRAVENOUS | Status: DC | PRN

## 2023-10-26 MED ORDER — PHENYLEPHRINE 80 MCG/ML (10ML) SYRINGE FOR IV PUSH (FOR BLOOD PRESSURE SUPPORT): 80.0000 ug | PREFILLED_SYRINGE | INTRAVENOUS | Status: DC | PRN

## 2023-10-26 MED ORDER — CEFAZOLIN SODIUM-DEXTROSE 2-4 GM/100ML-% IV SOLN
2.0000 g | Freq: Once | INTRAVENOUS | Status: AC
  Administered 2023-10-26: 2 g via INTRAVENOUS
  Filled 2023-10-26: qty 100

## 2023-10-26 MED ORDER — FENTANYL CITRATE (PF) 100 MCG/2ML IJ SOLN
50.0000 ug | INTRAMUSCULAR | Status: DC | PRN
  Administered 2023-10-26: 100 ug via INTRAVENOUS
  Filled 2023-10-26: qty 2

## 2023-10-26 MED ORDER — DIPHENHYDRAMINE HCL 50 MG/ML IJ SOLN: 12.5000 mg | INTRAMUSCULAR | Status: DC | PRN

## 2023-10-26 MED ORDER — MISOPROSTOL 25 MCG QUARTER TABLET: 25.0000 ug | ORAL_TABLET | Freq: Once | ORAL | Status: DC

## 2023-10-26 MED ORDER — LIDOCAINE HCL (PF) 1 % IJ SOLN
INTRAMUSCULAR | Status: DC | PRN
  Administered 2023-10-26: 2 mL via EPIDURAL
  Administered 2023-10-26: 5 mL via EPIDURAL
  Administered 2023-10-26: 3 mL via EPIDURAL

## 2023-10-26 MED ORDER — OXYTOCIN-SODIUM CHLORIDE 30-0.9 UT/500ML-% IV SOLN
1.0000 m[IU]/min | INTRAVENOUS | Status: DC
Start: 2023-10-26 — End: 2023-10-26

## 2023-10-26 MED ORDER — OXYTOCIN-SODIUM CHLORIDE 30-0.9 UT/500ML-% IV SOLN
2.5000 [IU]/h | INTRAVENOUS | Status: DC
  Administered 2023-10-27: 2.5 [IU]/h via INTRAVENOUS

## 2023-10-26 MED ORDER — SOD CITRATE-CITRIC ACID 500-334 MG/5ML PO SOLN
30.0000 mL | ORAL | Status: DC | PRN
Start: 2023-10-26 — End: 2023-10-27

## 2023-10-26 MED ORDER — MISOPROSTOL 50MCG HALF TABLET: 50.0000 ug | ORAL_TABLET | Freq: Once | ORAL | Status: DC

## 2023-10-26 MED ORDER — OXYTOCIN-SODIUM CHLORIDE 30-0.9 UT/500ML-% IV SOLN
1.0000 m[IU]/min | INTRAVENOUS | Status: DC
Start: 2023-10-26 — End: 2023-10-27
  Administered 2023-10-26: 1 m[IU]/min via INTRAVENOUS
  Filled 2023-10-26: qty 500

## 2023-10-26 MED ORDER — LACTATED RINGERS IV SOLN: 500.0000 mL | INTRAVENOUS | Status: DC | PRN

## 2023-10-26 MED ORDER — OXYTOCIN BOLUS FROM INFUSION
333.0000 mL | Freq: Once | INTRAVENOUS | Status: AC
  Administered 2023-10-27: 333 mL via INTRAVENOUS

## 2023-10-26 MED ORDER — TERBUTALINE SULFATE 1 MG/ML IJ SOLN
0.2500 mg | Freq: Once | INTRAMUSCULAR | Status: DC | PRN
  Filled 2023-10-26: qty 1

## 2023-10-26 MED ORDER — HYDROXYZINE HCL 50 MG PO TABS
50.0000 mg | ORAL_TABLET | Freq: Four times a day (QID) | ORAL | Status: DC | PRN
Start: 2023-10-26 — End: 2023-10-27

## 2023-10-26 MED ORDER — FLEET ENEMA RE ENEM: 1.0000 | ENEMA | Freq: Every day | RECTAL | Status: DC | PRN

## 2023-10-26 MED ORDER — FENTANYL-BUPIVACAINE-NACL 0.5-0.125-0.9 MG/250ML-% EP SOLN
12.0000 mL/h | EPIDURAL | Status: DC | PRN
  Administered 2023-10-26: 12 mL/h via EPIDURAL
  Filled 2023-10-26: qty 250

## 2023-10-26 NOTE — H&P (Signed)
OBSTETRIC ADMISSION HISTORY AND PHYSICAL  Carla Andrews is a 22 y.o. female G1P0 with IUP at [redacted]w[redacted]d (dated by LMP c/w 32 wk [place]), Estimated Date of Delivery: 10/21/23) presenting for latent labor.  Pt presented to MAU with complaint of spotting and regular contractions that have become increasingly painful. Denies LOF, though was seen on 11/16 with c/o LOF, where evaluation was negative for SROM.  She reports +FMs, No LOF, no VB, no blurry vision, headaches or peripheral edema, and RUQ pain.    She plans on breast feeding. She is undecided on what she would like to do for birth control.  She received her prenatal [facility]   Prenatal History/Complications:  - anemia on iron - asthma - late to prenatal care at 32 weeks - GBS positive  - THC and EtOH use during pregnancy (when she was unaware of pregnancy)  Past Medical History: Past Medical History:  Diagnosis Date   Asthma     Past Surgical History: Past Surgical History:  Procedure Laterality Date   NO PAST SURGERIES      Obstetrical History: OB History     Gravida  1   Para      Term      Preterm      AB      Living         SAB      IAB      Ectopic      Multiple      Live Births              Social History Social History   Socioeconomic History   Marital status: Single    Spouse name: Not on file   Number of children: Not on file   Years of education: Not on file   Highest education level: Not on file  Occupational History   Not on file  Tobacco Use   Smoking status: Never   Smokeless tobacco: Never  Vaping Use   Vaping status: Former  Substance and Sexual Activity   Alcohol use: Not Currently    Comment: socially   Drug use: Not Currently    Types: Marijuana    Comment: last use several months ag   Sexual activity: Not Currently  Other Topics Concern   Not on file  Social History Narrative   Not on file   Social Determinants of Health   Financial Resource Strain:  Not on file  Food Insecurity: Not on file  Transportation Needs: Not on file  Physical Activity: Not on file  Stress: Not on file  Social Connections: Not on file    Family History: Family History  Problem Relation Age of Onset   High blood pressure Mother    High Cholesterol Mother    Anemia Mother    Stroke Father    Anxiety disorder Father    Depression Father    High blood pressure Father    High Cholesterol Father    Cancer Maternal Aunt     Allergies: Allergies  Allergen Reactions   Amoxicillin    Penicillins     Medications Prior to Admission  Medication Sig Dispense Refill Last Dose   ferrous sulfate 324 MG TBEC Take 324 mg by mouth.   Past Week   Prenatal Vit-Fe Fumarate-FA (MULTIVITAMIN-PRENATAL) 27-0.8 MG TABS tablet Take 1 tablet by mouth daily at 12 noon.   10/25/2023     Review of Systems  All systems reviewed and negative except  as stated in HPI.  Blood pressure 115/79, pulse 65, temperature 98.5 F (36.9 C), temperature source Oral, resp. rate 17, height 5\' 6"  (1.676 m), weight 72.7 kg, last menstrual period 01/05/2023, SpO2 100%. General appearance: alert and cooperative Lungs: breathing comfortably on room air Heart: regular rate Abdomen: soft, non-tender; gravid Extremities: no edema of bilateral lower extremities Presentation: cephalic Fetal monitoring: 135/mod/+accels/decels absent Uterine activity: every 4-5 min Dilation: 4 Effacement (%): 80 Station: -1 Exam by:: Ginnie Smart RN   Prenatal labs: ABO, Rh: O/Positive/-- (09/11 1524) Antibody: Negative (09/11 1524) Rubella: 1.25 (09/11 1524) RPR: Non Reactive (09/25 0955)  HBsAg: Negative (09/11 1524)  HIV: Non Reactive (09/25 0955)  GBS: Positive/-- (10/22 1129)  2 hr Glucola wnl Genetic screening  not done Anatomy US normal Last [place]: At [redacted]w[redacted]d - cephalic presentation, EFW 2830g (30 %tile), AC 48%tile  Prenatal Transfer Tool  Maternal Diabetes: No Genetic Screening:  Declined Maternal Ultrasounds/Referrals: Normal Fetal Ultrasounds or other Referrals:  None Maternal Substance Abuse:  Yes:  Type: Marijuana, Other: alcohol use (prior to finding out she was pregnant) Significant Maternal Medications:  None Significant Maternal Lab Results:  Group B Strep positive Number of Prenatal Visits:greater than 3 verified prenatal visits Other Comments:  None  No results found for this or any previous visit (from the past 24 hour(s)).  Patient Active Problem List   Diagnosis Date Noted   Post term pregnancy 10/26/2023   Group beta Strep positive 10/03/2023   Anemia, antepartum, third trimester 09/02/2023   Supervision of normal first pregnancy, antepartum 08/19/2023   Late prenatal care affecting pregnancy 08/19/2023    Assessment/Plan:  Carla Andrews is a 22 y.o. G1P0 at [redacted]w[redacted]d here for latent labor.  #Labor: Per pt report, possible SROM, though fern slide negative in MAU and RN able to feel membranes  discussed labor augmentation with patient, including AROM and Pitocin, though will await adequate tx of GBS before proceeding #Pain: Epidural placed #FWB: Cat I #ID:  GBS positive- Ancef per protocol #MOF: Breast #MOC:Unsure #Circ:  Yes  #Anemia: HgB in Sept 9, CBC today hgb 12.4   Myna Hidalgo, DO Attending Obstetrician & Gynecologist, Faculty Practice Center for [employer], [facility]

## 2023-10-26 NOTE — MAU Note (Signed)
Fern slide inconclusive due to bloody show.  Membranes felt with SVE - MD will re evaluate on LD.

## 2023-10-26 NOTE — MAU Note (Signed)
..  Carla Andrews is a 22 y.o. at [redacted]w[redacted]d here in MAU reporting: pink spotting that comes out when she is having a contraction, first time she saw it was 0341. Reports contractions every 4-7 minutes   Nausea that began when she got to the hospital.  +FM.   Pain score: 8/10 Vitals:   10/26/23 0447  BP: 115/79  Pulse: 65  Resp: 17  Temp: 98.5 F (36.9 C)     FHT: 130

## 2023-10-26 NOTE — MAU Note (Signed)
Pt on R side vomiting cardio monitoring maternal heart rate-immediate return to baseline with return to high fowlers position

## 2023-10-26 NOTE — Anesthesia Procedure Notes (Signed)
Epidural Patient location during procedure: OB Start time: 10/26/2023 8:09 AM End time: 10/26/2023 8:16 AM  Staffing Anesthesiologist: Collene Schlichter, MD Performed: anesthesiologist   Preanesthetic Checklist Completed: patient identified, IV checked, risks and benefits discussed, monitors and equipment checked, pre-op evaluation and timeout performed  Epidural Patient position: sitting Prep: DuraPrep Patient monitoring: blood pressure and continuous pulse ox Approach: midline Location: L3-L4 Injection technique: LOR air  Needle:  Needle type: Tuohy  Needle gauge: 17 G Needle length: 9 cm Needle insertion depth: 6 cm Catheter size: 19 Gauge Catheter at skin depth: 11 cm Test dose: negative and Other (1% Lidocaine)  Additional Notes Patient identified.  Risk benefits discussed including failed block, incomplete pain control, headache, nerve damage, paralysis, blood pressure changes, nausea, vomiting, reactions to medication both toxic or allergic, and postpartum back pain.  Patient expressed understanding and wished to proceed.  All questions were answered.  Sterile technique used throughout procedure and epidural site dressed with sterile barrier dressing. No paresthesia or other complications noted. The patient did not experience any signs of intravascular injection such as tinnitus or metallic taste in mouth nor signs of intrathecal spread such as rapid motor block. Please see nursing notes for vital signs. Reason for block:procedure for pain

## 2023-10-26 NOTE — Anesthesia Preprocedure Evaluation (Signed)
Anesthesia Evaluation  Patient identified by MRN, date of birth, ID band Patient awake    Reviewed: Allergy & Precautions, NPO status , Patient's Chart, lab work & pertinent test results  Airway Mallampati: II  TM Distance: >3 FB Neck ROM: Full    Dental  (+) Teeth Intact, Dental Advisory Given   Pulmonary asthma    Pulmonary exam normal breath sounds clear to auscultation       Cardiovascular negative cardio ROS Normal cardiovascular exam Rhythm:Regular Rate:Normal     Neuro/Psych negative neurological ROS     GI/Hepatic negative GI ROS, Neg liver ROS,,,  Endo/Other  negative endocrine ROS    Renal/GU negative Renal ROS     Musculoskeletal negative musculoskeletal ROS (+)    Abdominal   Peds  Hematology negative hematology ROS (+) Plt 221k    Anesthesia Other Findings Day of surgery medications reviewed with the patient.  Reproductive/Obstetrics (+) Pregnancy                             Anesthesia Physical Anesthesia Plan  ASA: 2  Anesthesia Plan: Epidural   Post-op Pain Management:    Induction:   PONV Risk Score and Plan: 2 and Treatment may vary due to age or medical condition  Airway Management Planned: Natural Airway  Additional Equipment:   Intra-op Plan:   Post-operative Plan:   Informed Consent: I have reviewed the patients History and Physical, chart, labs and discussed the procedure including the risks, benefits and alternatives for the proposed anesthesia with the patient or authorized representative who has indicated his/her understanding and acceptance.     Dental advisory given  Plan Discussed with:   Anesthesia Plan Comments: (Patient identified. Risks/Benefits/Options discussed with patient including but not limited to bleeding, infection, nerve damage, paralysis, failed block, incomplete pain control, headache, blood pressure changes, nausea,  vomiting, reactions to medication both or allergic, itching and postpartum back pain. Confirmed with bedside nurse the patient's most recent platelet count. Confirmed with patient that they are not currently taking any anticoagulation, have any bleeding history or any family history of bleeding disorders. Patient expressed understanding and wished to proceed. All questions were answered. )       Anesthesia Quick Evaluation

## 2023-10-26 NOTE — Progress Notes (Signed)
Patient ID: Carla Andrews, female   DOB: 28-Jan-2001, 22 y.o.   MRN: [id number] Feeling some pressure Comfortable with epidural  Vitals:   10/26/23 2050 10/26/23 2053 10/26/23 2100 10/26/23 2110  BP:      Pulse:      Resp:      Temp:      TempSrc:      SpO2: 98% 98% 96% 100%  Weight:      Height:       FHR stable, reassuring Baseline 130, average variability + accels, no decels  Dilation: Lip/rim Effacement (%): 100 Cervical Position: Anterior Station: 0 Presentation: Vertex Exam by:: Wynelle Bourgeois, CNM  Anticipate SVD

## 2023-10-26 NOTE — Progress Notes (Signed)
OB PN:  S: Pt resting comfortably, no acute complaints  O: BP (!) 96/57   Pulse (!) 55   Temp 98.3 F (36.8 C) (Oral)   Resp 16   Ht 5\' 6"  (1.676 m)   Wt 72.7 kg   LMP 01/05/2023   SpO2 97%   BMI 25.86 kg/m   FHT: 120bpm, moderate variablity, + accels, variable decel x 1- resolved with position change Toco: q2-38min SVE: 4/60/-2- AROM clear fluid  A/P: 22 y.o. G1P0 @ [redacted]w[redacted]d for latent labor 1. FWB: Cat. II- due to variable, overall FHT reassuring, mostly Cat. I tracing 2. Labor: s/p AROM, may start Pitocin if contractions space or no further cervical change Pain: epidural in place GBS: positive- Ancef per protocol  Myna Hidalgo, DO Attending Obstetrician & Gynecologist, [facility] [employer], [facility]

## 2023-10-26 NOTE — Progress Notes (Signed)
Patient ID: Carla Andrews, female   DOB: November 27, 2001, 22 y.o.   MRN: [id number] RN reports cervix is completely dilated Discussed laboring down but patient wants to push  Pushed well to +1 station when FHR decelerated to 90s UCs were q at the time Lasted for 6-57min  Pitocin turned off Turned side to side with recovery to 130s, good variability and accels Dr Vergie Living updated  Will rest baby for at least Then perhaps resume trial of pushing

## 2023-10-27 ENCOUNTER — Encounter (HOSPITAL_COMMUNITY): Payer: Self-pay | Admitting: Obstetrics & Gynecology

## 2023-10-27 DIAGNOSIS — O99820 Streptococcus B carrier state complicating pregnancy: Secondary | ICD-10-CM

## 2023-10-27 DIAGNOSIS — O99324 Drug use complicating childbirth: Secondary | ICD-10-CM

## 2023-10-27 DIAGNOSIS — O0933 Supervision of pregnancy with insufficient antenatal care, third trimester: Secondary | ICD-10-CM | POA: Diagnosis not present

## 2023-10-27 DIAGNOSIS — O480 Post-term pregnancy: Secondary | ICD-10-CM

## 2023-10-27 DIAGNOSIS — Z3A40 40 weeks gestation of pregnancy: Secondary | ICD-10-CM

## 2023-10-27 MED ORDER — TETANUS-DIPHTH-ACELL PERTUSSIS 5-2.5-18.5 LF-MCG/0.5 IM SUSY
0.5000 mL | PREFILLED_SYRINGE | Freq: Once | INTRAMUSCULAR | Status: DC
  Filled 2023-10-27: qty 0.5

## 2023-10-27 MED ORDER — SENNOSIDES-DOCUSATE SODIUM 8.6-50 MG PO TABS
2.0000 | ORAL_TABLET | ORAL | Status: DC
Start: 2023-10-27 — End: 2023-10-28
  Filled 2023-10-27 (×2): qty 2

## 2023-10-27 MED ORDER — PRENATAL MULTIVITAMIN CH
1.0000 | ORAL_TABLET | Freq: Every day | ORAL | Status: DC
  Administered 2023-10-27 – 2023-10-28 (×2): 1 via ORAL
  Filled 2023-10-27 (×2): qty 1

## 2023-10-27 MED ORDER — WITCH HAZEL-GLYCERIN EX PADS: 1.0000 | MEDICATED_PAD | CUTANEOUS | Status: DC | PRN

## 2023-10-27 MED ORDER — ZOLPIDEM TARTRATE 5 MG PO TABS: 5.0000 mg | ORAL_TABLET | Freq: Every evening | ORAL | Status: DC | PRN

## 2023-10-27 MED ORDER — BENZOCAINE-MENTHOL 20-0.5 % EX AERO
1.0000 | INHALATION_SPRAY | CUTANEOUS | Status: DC | PRN
Start: 2023-10-27 — End: 2023-10-28

## 2023-10-27 MED ORDER — SIMETHICONE 80 MG PO CHEW: 80.0000 mg | CHEWABLE_TABLET | ORAL | Status: DC | PRN

## 2023-10-27 MED ORDER — ACETAMINOPHEN 325 MG PO TABS: 650.0000 mg | ORAL_TABLET | ORAL | Status: DC | PRN

## 2023-10-27 MED ORDER — ONDANSETRON HCL 4 MG PO TABS: 4.0000 mg | ORAL_TABLET | ORAL | Status: DC | PRN

## 2023-10-27 MED ORDER — IBUPROFEN 600 MG PO TABS
600.0000 mg | ORAL_TABLET | Freq: Four times a day (QID) | ORAL | Status: DC
Start: 2023-10-27 — End: 2023-10-28
  Administered 2023-10-27 – 2023-10-28 (×5): 600 mg via ORAL
  Filled 2023-10-27 (×6): qty 1

## 2023-10-27 MED ORDER — DIBUCAINE (PERIANAL) 1 % EX OINT: 1.0000 | TOPICAL_OINTMENT | CUTANEOUS | Status: DC | PRN

## 2023-10-27 MED ORDER — DIPHENHYDRAMINE HCL 25 MG PO CAPS: 25.0000 mg | ORAL_CAPSULE | Freq: Four times a day (QID) | ORAL | Status: DC | PRN

## 2023-10-27 MED ORDER — COCONUT OIL OIL: 1.0000 | TOPICAL_OIL | Status: DC | PRN

## 2023-10-27 MED ORDER — ONDANSETRON HCL 4 MG/2ML IJ SOLN: 4.0000 mg | INTRAMUSCULAR | Status: DC | PRN

## 2023-10-27 NOTE — Progress Notes (Signed)
Patient ID: Carla Andrews, female   DOB: [birth year], 22 y.o.   MRN: [id number] Started pushing again at 0030 AVSS  FHR stable with good variability  Intermittent late and variable decels with pushes  Dr Vergie Living updated and looked at tracing  Pushing well to quarter sized crowning  Will continue for now and consult if decels worsen

## 2023-10-27 NOTE — Anesthesia Postprocedure Evaluation (Signed)
Anesthesia Post Note  Patient: [place] A [place]im  Procedure(s) Performed: AN AD HOC LABOR EPIDURAL     Patient location during evaluation: Mother Baby Anesthesia Type: Epidural Level of consciousness: awake, oriented and awake and alert Pain management: pain level controlled Vital Signs Assessment: post-procedure vital signs reviewed and stable Respiratory status: spontaneous breathing, respiratory function stable and nonlabored ventilation Cardiovascular status: stable Postop Assessment: no headache, adequate PO intake, able to ambulate, patient able to bend at knees and no apparent nausea or vomiting Anesthetic complications: no   No notable events documented.  Last Vitals:  Vitals:   10/27/23 0509 10/27/23 0900  BP: 105/73 120/75  Pulse: 60 (!) 53  Resp:  18  Temp: 36.8 C 36.7 C  SpO2:  100%    Last Pain:  Vitals:   10/27/23 0900  TempSrc:   PainSc: 0-No pain   Pain Goal:                   Stephano Arrants

## 2023-10-27 NOTE — Plan of Care (Signed)
  Problem: Education: Goal: Knowledge of General Education information will improve Description: Including pain rating scale, medication(s)/side effects and non-pharmacologic comfort measures Outcome: Progressing   Problem: Health Behavior/Discharge Planning: Goal: Ability to manage health-related needs will improve Outcome: Progressing   Problem: Clinical Measurements: Goal: Ability to maintain clinical measurements within normal limits will improve Outcome: Progressing Goal: Will remain free from infection Outcome: Progressing Goal: Diagnostic test results will improve Outcome: Progressing Goal: Respiratory complications will improve Outcome: Progressing Goal: Cardiovascular complication will be avoided Outcome: Progressing   Problem: Activity: Goal: Risk for activity intolerance will decrease Outcome: Progressing   Problem: Nutrition: Goal: Adequate nutrition will be maintained Outcome: Progressing   Problem: Coping: Goal: Level of anxiety will decrease Outcome: Progressing   Problem: Elimination: Goal: Will not experience complications related to bowel motility Outcome: Progressing Goal: Will not experience complications related to urinary retention Outcome: Progressing   Problem: Pain Management: Goal: General experience of comfort will improve Outcome: Progressing   Problem: Safety: Goal: Ability to remain free from injury will improve Outcome: Progressing   Problem: Skin Integrity: Goal: Risk for impaired skin integrity will decrease Outcome: Progressing   Problem: Education: Goal: Knowledge of Childbirth will improve Outcome: Progressing Goal: Ability to make informed decisions regarding treatment and plan of care will improve Outcome: Progressing Goal: Ability to state and carry out methods to decrease the pain will improve Outcome: Progressing Goal: Individualized Educational Video(s) Outcome: Progressing   Problem: Coping: Goal: Ability to  verbalize concerns and feelings about labor and delivery will improve Outcome: Progressing   Problem: Life Cycle: Goal: Ability to make normal progression through stages of labor will improve Outcome: Progressing Goal: Ability to effectively push during vaginal delivery will improve Outcome: Progressing   Problem: Role Relationship: Goal: Will demonstrate positive interactions with the child Outcome: Progressing   Problem: Safety: Goal: Risk of complications during labor and delivery will decrease Outcome: Progressing   Problem: Pain Management: Goal: Relief or control of pain from uterine contractions will improve Outcome: Progressing   Problem: Education: Goal: Knowledge of condition will improve Outcome: Progressing Goal: Individualized Educational Video(s) Outcome: Progressing Goal: Individualized Newborn Educational Video(s) Outcome: Progressing   Problem: Activity: Goal: Will verbalize the importance of balancing activity with adequate rest periods Outcome: Progressing Goal: Ability to tolerate increased activity will improve Outcome: Progressing   Problem: Coping: Goal: Ability to identify and utilize available resources and services will improve Outcome: Progressing   Problem: Life Cycle: Goal: Chance of risk for complications during the postpartum period will decrease Outcome: Progressing   Problem: Role Relationship: Goal: Ability to demonstrate positive interaction with newborn will improve Outcome: Progressing   Problem: Skin Integrity: Goal: Demonstration of wound healing without infection will improve Outcome: Progressing

## 2023-10-27 NOTE — Discharge Summary (Signed)
Postpartum Discharge Summary  Patient Name: Carla Andrews DOB: 2001/03/23 MRN: [id number]  Date of admission: 10/26/2023 Delivery date:10/27/2023 Delivering provider: Wynelle Bourgeois Date of discharge: 10/28/2023  Admitting diagnosis: Post term pregnancy [O48.0] Intrauterine pregnancy: [redacted]w[redacted]d     Secondary diagnosis:  Principal Problem:   Post term pregnancy Active Problems:   Vaginal delivery  Additional problems: Late to care, ETOH/MJ use when didn't know she was pregnant    Discharge diagnosis: Term Pregnancy Delivered                                              Post partum procedures: None Augmentation: AROM and Pitocin Complications: None  Hospital course: Onset of Labor With Vaginal Delivery      22 y.o. yo G1P1001 at [redacted]w[redacted]d was admitted in Latent Labor on 10/26/2023. Labor course was complicated by decelerations, treated with turning off Pitocin, resting, then resumption of pushing.   Membrane Rupture Time/Date: 12:32 PM,10/26/2023  Delivery Method:Vaginal, Spontaneous Operative Delivery:N/A Episiotomy: None Lacerations:  Labial Patient had a uncomplicated postpartum course.  She is ambulating, tolerating a regular diet, passing flatus, and urinating well. Patient is discharged home in stable condition on 10/28/23.  Newborn Data: Birth date:10/27/2023 Birth time:1:43 AM Gender:Female Living status:Living Apgars:8 ,9  Weight:3380 g  Magnesium Sulfate received: No BMZ received: No Rhophylac:N/A MMR:No T-DaP: no Flu: No RSV Vaccine received: No Transfusion:No Immunizations administered: Immunization History  Administered Date(s) Administered   PFIZER(Purple Top)SARS-COV-2 Vaccination 03/19/2020, 04/09/2020    Physical exam  Vitals:   10/27/23 0900 10/27/23 1300 10/27/23 1700 10/27/23 2051  BP: 120/75 (!) 101/56 105/62 97/61  Pulse: (!) 53 (!) 54 60 64  Resp: 18 18 18 17   Temp: 98 F (36.7 C) 98.4 F (36.9 C) 98.1 F (36.7 C) 98.7 F (37.1 C)   TempSrc:  Oral Oral Oral  SpO2: 100% 99% 100% 99%  Weight:      Height:       General: alert, cooperative, and no distress Lochia: appropriate Uterine Fundus: firm Incision: N/A DVT Evaluation: No evidence of DVT seen on physical exam. Labs: Lab Results  Component Value Date   WBC 6.0 10/26/2023   HGB 12.4 10/26/2023   HCT 37.3 10/26/2023   MCV 91.9 10/26/2023   PLT 221 10/26/2023      Latest Ref Rng & Units 11/27/2021   10:55 AM  CMP  Glucose 70 - 99 mg/dL 433   BUN 6 - 20 mg/dL 11   Creatinine 2.95 - 1.00 mg/dL 1.88   Sodium 416 - 606 mmol/L 138   Potassium 3.5 - 5.1 mmol/L 3.8   Chloride 98 - 111 mmol/L 102   CO2 22 - 32 mmol/L 27   Calcium 8.9 - 10.3 mg/dL 9.4   Total Protein 6.5 - 8.1 g/dL 8.4   Total Bilirubin 0.3 - 1.2 mg/dL 0.4   Alkaline Phos 38 - 126 U/L 49   AST 15 - 41 U/L 29   ALT 0 - 44 U/L 22    Edinburgh Score:    10/27/2023    3:55 AM  Edinburgh Postnatal Depression Scale Screening Tool  I have been able to laugh and see the funny side of things. 0  I have looked forward with enjoyment to things. 0  I have blamed myself unnecessarily when things went wrong. 1  I have been anxious  or worried for no good reason. 1  I have felt scared or panicky for no good reason. 0  Things have been getting on top of me. 0  I have been so unhappy that I have had difficulty sleeping. 0  I have felt sad or miserable. 0  I have been so unhappy that I have been crying. 0  The thought of harming myself has occurred to me. 0  Edinburgh Postnatal Depression Scale Total 2      After visit meds:  Allergies as of 10/28/2023       Reactions   Amoxicillin    Penicillins         Medication List     TAKE these medications    acetaminophen 325 MG tablet Commonly known as: Tylenol Take 2 tablets (650 mg total) by mouth every 4 (four) hours as needed (for pain scale < 4).   ferrous sulfate 324 MG Tbec Take 324 mg by mouth.   ibuprofen 600 MG  tablet Commonly known as: ADVIL Take 1 tablet (600 mg total) by mouth every 6 (six) hours.   multivitamin-prenatal 27-0.8 MG Tabs tablet Take 1 tablet by mouth daily at 12 noon.   norethindrone 0.35 MG tablet Commonly known as: Ortho Micronor Take 1 tablet (0.35 mg total) by mouth daily.         Discharge home in stable condition Infant Feeding: Breast Infant Disposition:home with mother Discharge instruction: per After Visit Summary and Postpartum booklet. Activity: Advance as tolerated. Pelvic rest for 6 weeks.  Diet: routine diet Anticipated Birth Control: POPs Postpartum Appointment:4 weeks Additional Postpartum F/U:  None Future Appointments: Future Appointments  Date Time Provider Department Center  12/15/2023  9:35 AM Gerrit Heck, CNM [facility] None   Follow up Visit:  Follow-up Information     [facility] Follow up.   Contact information: [address] [place] [address] [phone]                     10/28/2023 Celedonio Savage, MD

## 2023-10-27 NOTE — Lactation Note (Signed)
This note was copied from a baby's chart. Lactation Consultation Note  Patient Name: Carla Andrews Today's Date: 10/27/2023 Age:21 hours Reason for consult: Initial assessment;Primapara;1st time breastfeeding;Exclusive pumping and bottle feeding;Term  P1- MOB states that she wants to exclusively pump only because she does not like the feeling of infant on the breast. LC set MOB up with the DEBP with a size 18 mm flange for left breast and 21 mm flange for right breast. LC encouraged MOB to switch to the 21 mm flange on the left breast if it feels too tight or pinching. MOB verbalized understanding and denied having any questions or concerns at this time.   LC reviewed feeding infant on cue 8-12x in 24 hrs, not allowing infant to go over 3 hrs without a feeding, pumping every 2.5 hrs to stay on schedule, CDC milk storage guidelines and LC services handout. LC encouraged MOB to call for further assistance as needed.  Maternal Data Does the patient have breastfeeding experience prior to this delivery?: No  Feeding Mother's Current Feeding Choice: Breast Milk   Lactation Tools Discussed/Used Tools: Pump;Flanges Flange Size: 18;21 (18 for left breast, 21 for right breast) Breast pump type: Double-Electric Breast Pump;Manual Pump Education: Setup, frequency, and cleaning;Milk Storage Reason for Pumping: exclusive pumping Pumping frequency: 15-20 min every 2-3 hrs  Interventions Interventions: Breast feeding basics reviewed;DEBP;Education;LC Services brochure  Discharge Discharge Education: Warning signs for feeding baby Pump: Hands Free;Personal  Consult Status Consult Status: Follow-up Date: 10/28/23 Follow-up type: In-patient    Dema Severin BS, IBCLC 10/27/2023, 6:13 PM

## 2023-10-28 ENCOUNTER — Inpatient Hospital Stay (HOSPITAL_COMMUNITY): Payer: BC Managed Care – PPO

## 2023-10-28 ENCOUNTER — Other Ambulatory Visit (HOSPITAL_COMMUNITY): Payer: Self-pay

## 2023-10-28 ENCOUNTER — Inpatient Hospital Stay (HOSPITAL_COMMUNITY)
Admission: RE | Admit: 2023-10-28 | Payer: BC Managed Care – PPO | Source: Home / Self Care | Admitting: Obstetrics & Gynecology

## 2023-10-28 MED ORDER — ACETAMINOPHEN 325 MG PO TABS
650.0000 mg | ORAL_TABLET | ORAL | 0 refills | Status: DC | PRN
  Filled 2023-10-28: qty 60, 5d supply, fill #0

## 2023-10-28 MED ORDER — NORETHINDRONE 0.35 MG PO TABS
1.0000 | ORAL_TABLET | Freq: Every day | ORAL | 11 refills | Status: DC
  Filled 2023-10-28: qty 28, 28d supply, fill #0

## 2023-10-28 MED ORDER — IBUPROFEN 600 MG PO TABS
600.0000 mg | ORAL_TABLET | Freq: Four times a day (QID) | ORAL | 0 refills | Status: DC
  Filled 2023-10-28: qty 30, 8d supply, fill #0

## 2023-10-28 NOTE — Plan of Care (Signed)
Problem: Education: Goal: Knowledge of General Education information will improve Description: Including pain rating scale, medication(s)/side effects and non-pharmacologic comfort measures 10/28/2023 1038 by Donne Hazel, LPN Outcome: Adequate for Discharge 10/28/2023 0844 by Donne Hazel, LPN Outcome: Progressing 10/28/2023 0843 by Donne Hazel, LPN Outcome: Progressing   Problem: Health Behavior/Discharge Planning: Goal: Ability to manage health-related needs will improve 10/28/2023 1038 by Donne Hazel, LPN Outcome: Adequate for Discharge 10/28/2023 0844 by Donne Hazel, LPN Outcome: Progressing 10/28/2023 0843 by Donne Hazel, LPN Outcome: Progressing   Problem: Clinical Measurements: Goal: Ability to maintain clinical measurements within normal limits will improve 10/28/2023 1038 by Donne Hazel, LPN Outcome: Adequate for Discharge 10/28/2023 0844 by Donne Hazel, LPN Outcome: Progressing 10/28/2023 0843 by Donne Hazel, LPN Outcome: Progressing Goal: Will remain free from infection 10/28/2023 1038 by Donne Hazel, LPN Outcome: Adequate for Discharge 10/28/2023 0844 by Donne Hazel, LPN Outcome: Progressing 10/28/2023 0843 by Donne Hazel, LPN Outcome: Progressing Goal: Diagnostic test results will improve 10/28/2023 1038 by Donne Hazel, LPN Outcome: Adequate for Discharge 10/28/2023 0844 by Donne Hazel, LPN Outcome: Progressing 10/28/2023 0843 by Donne Hazel, LPN Outcome: Progressing Goal: Respiratory complications will improve 10/28/2023 1038 by Donne Hazel, LPN Outcome: Adequate for Discharge 10/28/2023 0844 by Donne Hazel, LPN Outcome: Progressing 10/28/2023 0843 by Donne Hazel, LPN Outcome: Progressing Goal: Cardiovascular complication will be avoided 10/28/2023 1038 by Donne Hazel, LPN Outcome: Adequate for Discharge 10/28/2023 0844 by Donne Hazel, LPN Outcome: Progressing 10/28/2023 0843 by Donne Hazel, LPN Outcome: Progressing   Problem: Activity: Goal: Risk for activity intolerance will decrease 10/28/2023 1038 by Donne Hazel, LPN Outcome: Adequate for Discharge 10/28/2023 0844 by Donne Hazel, LPN Outcome: Progressing 10/28/2023 0843 by Donne Hazel, LPN Outcome: Progressing   Problem: Nutrition: Goal: Adequate nutrition will be maintained 10/28/2023 1038 by Donne Hazel, LPN Outcome: Adequate for Discharge 10/28/2023 0844 by Donne Hazel, LPN Outcome: Progressing 10/28/2023 0843 by Donne Hazel, LPN Outcome: Progressing   Problem: Coping: Goal: Level of anxiety will decrease 10/28/2023 1038 by Donne Hazel, LPN Outcome: Adequate for Discharge 10/28/2023 0844 by Donne Hazel, LPN Outcome: Progressing 10/28/2023 0843 by Donne Hazel, LPN Outcome: Progressing   Problem: Elimination: Goal: Will not experience complications related to bowel motility 10/28/2023 1038 by Donne Hazel, LPN Outcome: Adequate for Discharge 10/28/2023 0844 by Donne Hazel, LPN Outcome: Progressing 10/28/2023 0843 by Donne Hazel, LPN Outcome: Progressing Goal: Will not experience complications related to urinary retention 10/28/2023 1038 by Donne Hazel, LPN Outcome: Adequate for Discharge 10/28/2023 0844 by Donne Hazel, LPN Outcome: Progressing 10/28/2023 0843 by Donne Hazel, LPN Outcome: Progressing   Problem: Pain Management: Goal: General experience of comfort will improve 10/28/2023 1038 by Donne Hazel, LPN Outcome: Adequate for Discharge 10/28/2023 0844 by Donne Hazel, LPN Outcome: Progressing 10/28/2023 0843 by Donne Hazel, LPN Outcome: Progressing   Problem: Safety: Goal: Ability to remain free from injury will improve 10/28/2023 1038 by Donne Hazel, LPN Outcome: Adequate for Discharge 10/28/2023 0844 by Donne Hazel, LPN Outcome: Progressing 10/28/2023 0843 by Donne Hazel, LPN Outcome: Progressing   Problem:  Skin Integrity: Goal: Risk for impaired skin integrity will decrease 10/28/2023 1038 by Donne Hazel, LPN Outcome: Adequate for Discharge 10/28/2023 0844 by Donne Hazel, LPN Outcome: Progressing 10/28/2023 0843 by Donne Hazel, LPN  Outcome: Progressing   Problem: Education: Goal: Knowledge of Childbirth will improve 10/28/2023 1038 by Donne Hazel, LPN Outcome: Adequate for Discharge 10/28/2023 0844 by Donne Hazel, LPN Outcome: Progressing 10/28/2023 0843 by Donne Hazel, LPN Outcome: Progressing Goal: Ability to make informed decisions regarding treatment and plan of care will improve 10/28/2023 1038 by Donne Hazel, LPN Outcome: Adequate for Discharge 10/28/2023 0844 by Donne Hazel, LPN Outcome: Progressing 10/28/2023 0843 by Donne Hazel, LPN Outcome: Progressing Goal: Ability to state and carry out methods to decrease the pain will improve 10/28/2023 1038 by Donne Hazel, LPN Outcome: Adequate for Discharge 10/28/2023 0844 by Donne Hazel, LPN Outcome: Progressing 10/28/2023 0843 by Donne Hazel, LPN Outcome: Progressing Goal: Individualized Educational Video(s) 10/28/2023 1038 by Donne Hazel, LPN Outcome: Adequate for Discharge 10/28/2023 0844 by Donne Hazel, LPN Outcome: Progressing 10/28/2023 0843 by Donne Hazel, LPN Outcome: Progressing   Problem: Coping: Goal: Ability to verbalize concerns and feelings about labor and delivery will improve 10/28/2023 1038 by Donne Hazel, LPN Outcome: Adequate for Discharge 10/28/2023 0844 by Donne Hazel, LPN Outcome: Progressing 10/28/2023 0843 by Donne Hazel, LPN Outcome: Progressing   Problem: Life Cycle: Goal: Ability to make normal progression through stages of labor will improve 10/28/2023 1038 by Donne Hazel, LPN Outcome: Adequate for Discharge 10/28/2023 0844 by Donne Hazel, LPN Outcome: Progressing 10/28/2023 0843 by Donne Hazel, LPN Outcome:  Progressing Goal: Ability to effectively push during vaginal delivery will improve 10/28/2023 1038 by Donne Hazel, LPN Outcome: Adequate for Discharge 10/28/2023 0844 by Donne Hazel, LPN Outcome: Progressing 10/28/2023 0843 by Donne Hazel, LPN Outcome: Progressing   Problem: Role Relationship: Goal: Will demonstrate positive interactions with the child 10/28/2023 1038 by Donne Hazel, LPN Outcome: Adequate for Discharge 10/28/2023 0844 by Donne Hazel, LPN Outcome: Progressing 10/28/2023 0843 by Donne Hazel, LPN Outcome: Progressing   Problem: Safety: Goal: Risk of complications during labor and delivery will decrease 10/28/2023 1038 by Donne Hazel, LPN Outcome: Adequate for Discharge 10/28/2023 0844 by Donne Hazel, LPN Outcome: Progressing 10/28/2023 0843 by Donne Hazel, LPN Outcome: Progressing   Problem: Pain Management: Goal: Relief or control of pain from uterine contractions will improve 10/28/2023 1038 by Donne Hazel, LPN Outcome: Adequate for Discharge 10/28/2023 0844 by Donne Hazel, LPN Outcome: Progressing 10/28/2023 0843 by Donne Hazel, LPN Outcome: Progressing   Problem: Education: Goal: Knowledge of condition will improve 10/28/2023 1038 by Donne Hazel, LPN Outcome: Adequate for Discharge 10/28/2023 0844 by Donne Hazel, LPN Outcome: Progressing 10/28/2023 0843 by Donne Hazel, LPN Outcome: Progressing Goal: Individualized Educational Video(s) 10/28/2023 1038 by Donne Hazel, LPN Outcome: Adequate for Discharge 10/28/2023 0844 by Donne Hazel, LPN Outcome: Progressing 10/28/2023 0843 by Donne Hazel, LPN Outcome: Progressing Goal: Individualized Newborn Educational Video(s) 10/28/2023 1038 by Donne Hazel, LPN Outcome: Adequate for Discharge 10/28/2023 0844 by Donne Hazel, LPN Outcome: Progressing 10/28/2023 0843 by Donne Hazel, LPN Outcome: Progressing   Problem: Activity: Goal: Will  verbalize the importance of balancing activity with adequate rest periods 10/28/2023 1038 by Donne Hazel, LPN Outcome: Adequate for Discharge 10/28/2023 0844 by Donne Hazel, LPN Outcome: Progressing 10/28/2023 0843 by Donne Hazel, LPN Outcome: Progressing Goal: Ability to tolerate increased activity will improve 10/28/2023 1038 by Donne Hazel, LPN Outcome: Adequate for Discharge 10/28/2023 0844 by Donne Hazel, LPN  Outcome: Progressing 10/28/2023 0843 by Donne Hazel, LPN Outcome: Progressing   Problem: Coping: Goal: Ability to identify and utilize available resources and services will improve 10/28/2023 1038 by Donne Hazel, LPN Outcome: Adequate for Discharge 10/28/2023 0844 by Donne Hazel, LPN Outcome: Progressing 10/28/2023 0843 by Donne Hazel, LPN Outcome: Progressing   Problem: Life Cycle: Goal: Chance of risk for complications during the postpartum period will decrease 10/28/2023 1038 by Donne Hazel, LPN Outcome: Adequate for Discharge 10/28/2023 0844 by Donne Hazel, LPN Outcome: Progressing 10/28/2023 0843 by Donne Hazel, LPN Outcome: Progressing   Problem: Role Relationship: Goal: Ability to demonstrate positive interaction with newborn will improve 10/28/2023 1038 by Donne Hazel, LPN Outcome: Adequate for Discharge 10/28/2023 0844 by Donne Hazel, LPN Outcome: Progressing 10/28/2023 0843 by Donne Hazel, LPN Outcome: Progressing   Problem: Skin Integrity: Goal: Demonstration of wound healing without infection will improve 10/28/2023 1038 by Donne Hazel, LPN Outcome: Adequate for Discharge 10/28/2023 0844 by Donne Hazel, LPN Outcome: Progressing 10/28/2023 0843 by Donne Hazel, LPN Outcome: Progressing

## 2023-10-28 NOTE — Discharge Instructions (Signed)
WHAT TO LOOK OUT FOR: Fever of 100.4 or above Mastitis: feels like flu and breasts hurt Infection: increased pain, swelling or redness Blood clots golf ball size or larger Postpartum depression   Congratulations on your newest addition!

## 2023-10-28 NOTE — Social Work (Signed)
CSW received consult for hx of marijuana use.  Referral was screened out due to the following:  ~MOB had no documented substance use after initial prenatal visit/+UPT.  ~MOB had no positive drug screens after initial prenatal visit/+UPT.  ~Baby's UDS is negative.  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 MOB had more than 3 prenatal visits.  Please consult CSW if current concerns arise or by MOB's request.   CSW will monitor CDS results and make report to Child Protective Services if warranted.  Wende Neighbors, LCSWA Clinical Social Worker [phone]

## 2023-10-28 NOTE — Plan of Care (Signed)
Problem: Education: Goal: Knowledge of General Education information will improve Description: Including pain rating scale, medication(s)/side effects and non-pharmacologic comfort measures 10/28/2023 0844 by Donne Hazel, LPN Outcome: Progressing 10/28/2023 0843 by Donne Hazel, LPN Outcome: Progressing   Problem: Health Behavior/Discharge Planning: Goal: Ability to manage health-related needs will improve 10/28/2023 0844 by Donne Hazel, LPN Outcome: Progressing 10/28/2023 0843 by Donne Hazel, LPN Outcome: Progressing   Problem: Clinical Measurements: Goal: Ability to maintain clinical measurements within normal limits will improve 10/28/2023 0844 by Donne Hazel, LPN Outcome: Progressing 10/28/2023 0843 by Donne Hazel, LPN Outcome: Progressing Goal: Will remain free from infection 10/28/2023 0844 by Donne Hazel, LPN Outcome: Progressing 10/28/2023 0843 by Donne Hazel, LPN Outcome: Progressing Goal: Diagnostic test results will improve 10/28/2023 0844 by Donne Hazel, LPN Outcome: Progressing 10/28/2023 0843 by Donne Hazel, LPN Outcome: Progressing Goal: Respiratory complications will improve 10/28/2023 0844 by Donne Hazel, LPN Outcome: Progressing 10/28/2023 0843 by Donne Hazel, LPN Outcome: Progressing Goal: Cardiovascular complication will be avoided 10/28/2023 0844 by Donne Hazel, LPN Outcome: Progressing 10/28/2023 0843 by Donne Hazel, LPN Outcome: Progressing   Problem: Activity: Goal: Risk for activity intolerance will decrease 10/28/2023 0844 by Donne Hazel, LPN Outcome: Progressing 10/28/2023 0843 by Donne Hazel, LPN Outcome: Progressing   Problem: Nutrition: Goal: Adequate nutrition will be maintained 10/28/2023 0844 by Donne Hazel, LPN Outcome: Progressing 10/28/2023 0843 by Donne Hazel, LPN Outcome: Progressing   Problem: Coping: Goal: Level of anxiety will decrease 10/28/2023 0844 by Donne Hazel, LPN Outcome: Progressing 10/28/2023 0843 by Donne Hazel, LPN Outcome: Progressing   Problem: Elimination: Goal: Will not experience complications related to bowel motility 10/28/2023 0844 by Donne Hazel, LPN Outcome: Progressing 10/28/2023 0843 by Donne Hazel, LPN Outcome: Progressing Goal: Will not experience complications related to urinary retention 10/28/2023 0844 by Donne Hazel, LPN Outcome: Progressing 10/28/2023 0843 by Donne Hazel, LPN Outcome: Progressing   Problem: Pain Management: Goal: General experience of comfort will improve 10/28/2023 0844 by Donne Hazel, LPN Outcome: Progressing 10/28/2023 0843 by Donne Hazel, LPN Outcome: Progressing   Problem: Safety: Goal: Ability to remain free from injury will improve 10/28/2023 0844 by Donne Hazel, LPN Outcome: Progressing 10/28/2023 0843 by Donne Hazel, LPN Outcome: Progressing   Problem: Skin Integrity: Goal: Risk for impaired skin integrity will decrease 10/28/2023 0844 by Donne Hazel, LPN Outcome: Progressing 10/28/2023 0843 by Donne Hazel, LPN Outcome: Progressing   Problem: Education: Goal: Knowledge of Childbirth will improve 10/28/2023 0844 by Donne Hazel, LPN Outcome: Progressing 10/28/2023 0843 by Donne Hazel, LPN Outcome: Progressing Goal: Ability to make informed decisions regarding treatment and plan of care will improve 10/28/2023 0844 by Donne Hazel, LPN Outcome: Progressing 10/28/2023 0843 by Donne Hazel, LPN Outcome: Progressing Goal: Ability to state and carry out methods to decrease the pain will improve 10/28/2023 0844 by Donne Hazel, LPN Outcome: Progressing 10/28/2023 0843 by Donne Hazel, LPN Outcome: Progressing Goal: Individualized Educational Video(s) 10/28/2023 0844 by Donne Hazel, LPN Outcome: Progressing 10/28/2023 0843 by Donne Hazel, LPN Outcome: Progressing   Problem: Coping: Goal: Ability to verbalize  concerns and feelings about labor and delivery will improve 10/28/2023 0844 by Donne Hazel, LPN Outcome: Progressing 10/28/2023 0843 by Donne Hazel, LPN Outcome: Progressing   Problem: Life Cycle: Goal: Ability to make normal progression through stages of  labor will improve 10/28/2023 0844 by Donne Hazel, LPN Outcome: Progressing 10/28/2023 0843 by Donne Hazel, LPN Outcome: Progressing Goal: Ability to effectively push during vaginal delivery will improve 10/28/2023 0844 by Donne Hazel, LPN Outcome: Progressing 10/28/2023 0843 by Donne Hazel, LPN Outcome: Progressing   Problem: Role Relationship: Goal: Will demonstrate positive interactions with the child 10/28/2023 0844 by Donne Hazel, LPN Outcome: Progressing 10/28/2023 0843 by Donne Hazel, LPN Outcome: Progressing   Problem: Safety: Goal: Risk of complications during labor and delivery will decrease 10/28/2023 0844 by Donne Hazel, LPN Outcome: Progressing 10/28/2023 0843 by Donne Hazel, LPN Outcome: Progressing   Problem: Pain Management: Goal: Relief or control of pain from uterine contractions will improve 10/28/2023 0844 by Donne Hazel, LPN Outcome: Progressing 10/28/2023 0843 by Donne Hazel, LPN Outcome: Progressing   Problem: Education: Goal: Knowledge of condition will improve 10/28/2023 0844 by Donne Hazel, LPN Outcome: Progressing 10/28/2023 0843 by Donne Hazel, LPN Outcome: Progressing Goal: Individualized Educational Video(s) 10/28/2023 0844 by Donne Hazel, LPN Outcome: Progressing 10/28/2023 0843 by Donne Hazel, LPN Outcome: Progressing Goal: Individualized Newborn Educational Video(s) 10/28/2023 0844 by Donne Hazel, LPN Outcome: Progressing 10/28/2023 0843 by Donne Hazel, LPN Outcome: Progressing   Problem: Activity: Goal: Will verbalize the importance of balancing activity with adequate rest periods 10/28/2023 0844 by Donne Hazel,  LPN Outcome: Progressing 10/28/2023 0843 by Donne Hazel, LPN Outcome: Progressing Goal: Ability to tolerate increased activity will improve 10/28/2023 0844 by Donne Hazel, LPN Outcome: Progressing 10/28/2023 0843 by Donne Hazel, LPN Outcome: Progressing   Problem: Coping: Goal: Ability to identify and utilize available resources and services will improve 10/28/2023 0844 by Donne Hazel, LPN Outcome: Progressing 10/28/2023 0843 by Donne Hazel, LPN Outcome: Progressing   Problem: Life Cycle: Goal: Chance of risk for complications during the postpartum period will decrease 10/28/2023 0844 by Donne Hazel, LPN Outcome: Progressing 10/28/2023 0843 by Donne Hazel, LPN Outcome: Progressing   Problem: Role Relationship: Goal: Ability to demonstrate positive interaction with newborn will improve 10/28/2023 0844 by Donne Hazel, LPN Outcome: Progressing 10/28/2023 0843 by Donne Hazel, LPN Outcome: Progressing   Problem: Skin Integrity: Goal: Demonstration of wound healing without infection will improve 10/28/2023 0844 by Donne Hazel, LPN Outcome: Progressing 10/28/2023 0843 by Donne Hazel, LPN Outcome: Progressing

## 2023-10-28 NOTE — Plan of Care (Signed)
  Problem: Education: Goal: Knowledge of General Education information will improve Description: Including pain rating scale, medication(s)/side effects and non-pharmacologic comfort measures Outcome: Progressing   Problem: Health Behavior/Discharge Planning: Goal: Ability to manage health-related needs will improve Outcome: Progressing   Problem: Clinical Measurements: Goal: Ability to maintain clinical measurements within normal limits will improve Outcome: Progressing Goal: Will remain free from infection Outcome: Progressing Goal: Diagnostic test results will improve Outcome: Progressing Goal: Respiratory complications will improve Outcome: Progressing Goal: Cardiovascular complication will be avoided Outcome: Progressing   Problem: Activity: Goal: Risk for activity intolerance will decrease Outcome: Progressing   Problem: Nutrition: Goal: Adequate nutrition will be maintained Outcome: Progressing   Problem: Coping: Goal: Level of anxiety will decrease Outcome: Progressing   Problem: Elimination: Goal: Will not experience complications related to bowel motility Outcome: Progressing Goal: Will not experience complications related to urinary retention Outcome: Progressing   Problem: Pain Management: Goal: General experience of comfort will improve Outcome: Progressing   Problem: Safety: Goal: Ability to remain free from injury will improve Outcome: Progressing   Problem: Skin Integrity: Goal: Risk for impaired skin integrity will decrease Outcome: Progressing   Problem: Education: Goal: Knowledge of Childbirth will improve Outcome: Progressing Goal: Ability to make informed decisions regarding treatment and plan of care will improve Outcome: Progressing Goal: Ability to state and carry out methods to decrease the pain will improve Outcome: Progressing Goal: Individualized Educational Video(s) Outcome: Progressing   Problem: Coping: Goal: Ability to  verbalize concerns and feelings about labor and delivery will improve Outcome: Progressing   Problem: Life Cycle: Goal: Ability to make normal progression through stages of labor will improve Outcome: Progressing Goal: Ability to effectively push during vaginal delivery will improve Outcome: Progressing   Problem: Role Relationship: Goal: Will demonstrate positive interactions with the child Outcome: Progressing   Problem: Safety: Goal: Risk of complications during labor and delivery will decrease Outcome: Progressing   Problem: Pain Management: Goal: Relief or control of pain from uterine contractions will improve Outcome: Progressing   Problem: Education: Goal: Knowledge of condition will improve Outcome: Progressing Goal: Individualized Educational Video(s) Outcome: Progressing Goal: Individualized Newborn Educational Video(s) Outcome: Progressing   Problem: Activity: Goal: Will verbalize the importance of balancing activity with adequate rest periods Outcome: Progressing Goal: Ability to tolerate increased activity will improve Outcome: Progressing   Problem: Coping: Goal: Ability to identify and utilize available resources and services will improve Outcome: Progressing   Problem: Life Cycle: Goal: Chance of risk for complications during the postpartum period will decrease Outcome: Progressing   Problem: Role Relationship: Goal: Ability to demonstrate positive interaction with newborn will improve Outcome: Progressing   Problem: Skin Integrity: Goal: Demonstration of wound healing without infection will improve Outcome: Progressing

## 2023-11-06 ENCOUNTER — Telehealth (HOSPITAL_COMMUNITY): Payer: Self-pay | Admitting: *Deleted

## 2023-11-06 NOTE — Telephone Encounter (Signed)
Attempted hospital discharge follow-up call. Voicemail full. RN unable to leave message for patient. Deforest Hoyles, RN, 11/06/23, [phone]

## 2023-12-15 ENCOUNTER — Ambulatory Visit (INDEPENDENT_AMBULATORY_CARE_PROVIDER_SITE_OTHER): Payer: Medicaid Other

## 2023-12-15 NOTE — Progress Notes (Signed)
 Post Partum Visit Note  Carla Andrews is a 23 y.o. G63P1001 female who presents for a postpartum visit. She is 7 weeks postpartum following a normal spontaneous vaginal delivery.  I have fully reviewed the prenatal and intrapartum course. The delivery was at [redacted]w[redacted]d gestational weeks.  Anesthesia: epidural. Postpartum course has been uncomplicated. Baby is doing well. Baby is feeding by breast. Bleeding no bleeding. Bowel function is normal. Bladder function is normal. Patient is not sexually active. Contraception method is abstinence. Postpartum depression screening: negative.  Patient reports she is doing good and adjusting well to new role as mom.  Reports breastfeeding going good and denies breast issues.  She is no longer bleeding. She reports she receives infant support from her mother and grandmother.  No feelings of depression.  Patient does report some right side rib pain/cramping that lasts ~20 minute.  Reports laying down with relief. No correlation with eating. Noted after standing for extended periods or with certain movements.  Reports drinking water and cranberry juice.   The pregnancy intention screening data noted above was reviewed. Potential methods of contraception were discussed. The patient elected to proceed with No data recorded.   Edinburgh Postnatal Depression Scale - 12/15/23 0943       Edinburgh Postnatal Depression Scale:  In the Past 7 Days   I have been able to laugh and see the funny side of things. 0    I have looked forward with enjoyment to things. 0    I have blamed myself unnecessarily when things went wrong. 0    I have been anxious or worried for no good reason. 0    I have felt scared or panicky for no good reason. 0    Things have been getting on top of me. 0    I have been so unhappy that I have had difficulty sleeping. 0    I have felt sad or miserable. 0    I have been so unhappy that I have been crying. 0    The thought of harming myself has  occurred to me. 0    Edinburgh Postnatal Depression Scale Total 0             Health Maintenance Due  Topic Date Due   HPV VACCINES (1 - 3-dose series) Never done   DTaP/Tdap/Td (1 - Tdap) Never done   INFLUENZA VACCINE  Never done   COVID-19 Vaccine (4 - 2024-25 season) 08/09/2023   Cervical Cancer Screening (Pap smear)  Never done    The following portions of the patient's history were reviewed and updated as appropriate: allergies, current medications, past family history, past medical history, past social history, past surgical history, and problem list.  Review of Systems Pertinent items noted in HPI and remainder of comprehensive ROS otherwise negative.  Objective:  BP (!) 108/55   Pulse 72   Ht 5' 6 (1.676 m)   Wt 148 lb (67.1 kg)   LMP 12/11/2023 (Exact Date)   Breastfeeding Yes   BMI 23.89 kg/m    General:  alert, cooperative, and no distress   Breasts:  Not Evaluated  Lungs: clear to auscultation bilaterally  Heart:  regular rate and rhythm  Abdomen: normal findings: soft, non-tender   Wound N/a  GU exam:   Declines pelvic exam with pap smear today       Assessment:   7 weeks postpartum exam.  Normal Involution Breastfeeding Declines BC Musculoskeletal Pain  Plan:   -Congratulations  given. -Discussed breastfeeding and support.  -Reviewed musculoskeletal pain and encouraged to monitor  and report any worsening symptoms.  -Okay to return to normal activities, including sex, when desired.  -RTO in 3-4 months for annual exam with pap or as needed.  -Problem list reviewed and resolution of problems as appropriate.     Essential components of care per ACOG recommendations:  1.  Mood and well being: Patient with negative depression screening today. Reviewed local resources for support.  - Patient tobacco use? No.   - hx of drug use? No.    2. Infant care and feeding:  -Patient currently breastmilk feeding? Yes. Reviewed importance of draining  breast regularly to support lactation. She reports pumping 1-2x/day.  -Social determinants of health (SDOH) reviewed in EPIC. No concerns.  3. Sexuality, contraception and birth spacing - Patient does not want a pregnancy in the next year.  Desired family size is 3-4 children.  - Reviewed reproductive life planning. Reviewed contraceptive methods based on pt preferences and effectiveness.  Patient desired No Method - No Contraceptive Precautions today.   - Discussed birth spacing of 18 months  4. Sleep and fatigue -Encouraged family/partner/community support of 4 hrs of uninterrupted sleep to help with mood and fatigue  5. Physical Recovery  - Discussed patients delivery and complications. She describes her labor as good.  - Patient had a Vaginal, no problems at delivery. Patient had a  shallow labial  laceration that extended into the vagina. Perineal healing reviewed. Patient expressed understanding - Patient has urinary incontinence? Yes. Offered PT and patient declined.  States it only occurs with urination. - Patient is safe to resume physical and sexual activity  6.  Health Maintenance - HM due items addressed Yes - Last pap smear No results found for: DIAGPAP Pap smear not done at today's visit.  -Breast Cancer screening indicated? No.   7. Chronic Disease/Pregnancy Condition follow up: None  - PCP follow up  Harlene LITTIE Duncans, CNM [facility] [employer], [facility]

## 2024-03-15 ENCOUNTER — Other Ambulatory Visit (HOSPITAL_COMMUNITY)
Admission: RE | Admit: 2024-03-15 | Discharge: 2024-03-15 | Disposition: A | Discharge status: Home / Self Care | Source: Ambulatory Visit

## 2024-03-15 ENCOUNTER — Ambulatory Visit

## 2024-03-15 VITALS — BP 105/70 | HR 64 | Wt 145.0 lb

## 2024-03-15 DIAGNOSIS — N898 Other specified noninflammatory disorders of vagina: Secondary | ICD-10-CM | POA: Insufficient documentation

## 2024-03-15 NOTE — Progress Notes (Signed)
 SUBJECTIVE:  23 y.o. female complains of white vaginal discharge for 2 week(s). Denies abnormal vaginal bleeding or significant pelvic pain or fever. No UTI symptoms. Denies history of known exposure to STD.  No LMP recorded.  OBJECTIVE:  She appears well, afebrile. Urine dipstick: not done.  ASSESSMENT:  Vaginal Discharge  Vaginal Odor   PLAN:  GC, chlamydia, trichomonas, BVAG, CVAG probe sent to lab. Treatment: To be determined once lab results are received ROV prn if symptoms persist or worsen. Treg Diemer l Sian Rockers, CMA

## 2024-03-16 LAB — CERVICOVAGINAL ANCILLARY ONLY
Bacterial Vaginitis (gardnerella): POSITIVE — AB
Candida Glabrata: NEGATIVE
Candida Vaginitis: POSITIVE — AB
Chlamydia: NEGATIVE
Comment: NEGATIVE
Comment: NEGATIVE
Comment: NEGATIVE
Comment: NEGATIVE
Comment: NEGATIVE
Comment: NORMAL
Neisseria Gonorrhea: NEGATIVE
Trichomonas: NEGATIVE

## 2024-03-17 ENCOUNTER — Other Ambulatory Visit: Payer: Self-pay

## 2024-03-17 DIAGNOSIS — N760 Acute vaginitis: Secondary | ICD-10-CM

## 2024-03-17 DIAGNOSIS — B379 Candidiasis, unspecified: Secondary | ICD-10-CM

## 2024-03-17 MED ORDER — FLUCONAZOLE 150 MG PO TABS: ORAL_TABLET | ORAL | 0 refills | Status: DC

## 2024-03-17 MED ORDER — METRONIDAZOLE 0.75 % VA GEL: 1.0000 | Freq: Every day | VAGINAL | 0 refills | Status: DC

## 2024-03-17 NOTE — Progress Notes (Signed)
 Patient tested positive for BV and yeast. Metrogel insert 1 applicator vaginally x 5 nights and Diflucan 150 mg take 1 tablet was sent to her pharmacy. Sabrine Patchen l Savvy Peeters, CMA

## 2024-03-29 ENCOUNTER — Ambulatory Visit

## 2024-04-20 ENCOUNTER — Other Ambulatory Visit (HOSPITAL_COMMUNITY)
Admission: RE | Admit: 2024-04-20 | Discharge: 2024-04-20 | Disposition: A | Discharge status: Home / Self Care | Source: Ambulatory Visit | Attending: Obstetrics and Gynecology | Admitting: Obstetrics and Gynecology

## 2024-04-20 ENCOUNTER — Ambulatory Visit: Admitting: Obstetrics and Gynecology

## 2024-04-20 VITALS — BP 109/66 | HR 62 | Wt 141.0 lb

## 2024-04-20 DIAGNOSIS — Z3202 Encounter for pregnancy test, result negative: Secondary | ICD-10-CM

## 2024-04-20 DIAGNOSIS — N939 Abnormal uterine and vaginal bleeding, unspecified: Secondary | ICD-10-CM | POA: Insufficient documentation

## 2024-04-20 DIAGNOSIS — Z30011 Encounter for initial prescription of contraceptive pills: Secondary | ICD-10-CM

## 2024-04-20 LAB — POCT URINE PREGNANCY: Preg Test, Ur: NEGATIVE

## 2024-04-20 MED ORDER — NORETHINDRONE 0.35 MG PO TABS: 1.0000 | ORAL_TABLET | Freq: Every day | ORAL | 11 refills | Status: AC

## 2024-04-20 NOTE — Progress Notes (Signed)
   ESTABLISHED GYNECOLOGY VISIT No chief complaint on file.   Subjective:  Carla Andrews is a 23 y.o. G1P1001 presenting for abnormal periods  SVD in November 2024. She has been breastfeeding. Baby recently sick with a cold and her supply has been lower and she hasn't been breastfeeding as much lately.  No new medications No contraception Using condoms Recent BV & yeast infection, s/p treatment  UPT neg today    Review of Systems:   Pertinent items are noted in HPI  Pertinent History Reviewed:  Reviewed past medical,surgical, social and family history.  Reviewed problem list, medications and allergies.  Objective:   Vitals:   04/20/24 1110  BP: 109/66  Pulse: 62  Weight: 141 lb (64 kg)   Physical Examination:   General appearance - well appearing, and in no distress  Mental status - alert, oriented to person, place, and time  Psych:  normal mood and affect  Skin - warm and dry, normal color, no suspicious lesions noted  Abdomen - soft, nontender, nondistended, no masses or organomegaly  Pelvic -  VULVA: normal appearing vulva with no masses, tenderness or lesions   VAGINA: normal appearing vagina with normal color, no lesions, small blood CERVIX: normal appearing cervix without discharge or lesions   Extremities:  No swelling or varicosities noted  Chaperone present for exam  Assessment and Plan:  1. Abnormal uterine bleeding (Primary) Possible abnormal bleeding from change in breastfeeding schedule. Swab for infections. Can start OCP for cycle regulation. She chooses POPs. Rx given - f/u in 2 months to assess response - norethindrone  (ORTHO MICRONOR ) 0.35 MG tablet; Take 1 tablet (0.35 mg total) by mouth daily.  Dispense: 28 tablet; Refill: 11 - Cervicovaginal ancillary only  2. Encounter for initial prescription of contraceptive pills - norethindrone  (ORTHO MICRONOR ) 0.35 MG tablet; Take 1 tablet (0.35 mg total) by mouth daily.  Dispense: 28 tablet; Refill:  11  3. Breast feeding status of mother     No follow-ups on file.  Future Appointments  Date Time Provider Department Center  05/19/2024  3:30 PM Trevion Hoben, Jodelle Mungo, MD [facility] None    Marci Setter, MD, FACOG Obstetrician & Gynecologist, [facility] for [facility], [facility]

## 2024-04-26 LAB — CERVICOVAGINAL ANCILLARY ONLY
Bacterial Vaginitis (gardnerella): POSITIVE — AB
Candida Glabrata: NEGATIVE
Candida Vaginitis: NEGATIVE
Chlamydia: NEGATIVE
Comment: NEGATIVE
Comment: NEGATIVE
Comment: NEGATIVE
Comment: NEGATIVE
Comment: NEGATIVE
Comment: NORMAL
Neisseria Gonorrhea: NEGATIVE
Trichomonas: NEGATIVE

## 2024-04-27 ENCOUNTER — Ambulatory Visit: Payer: Self-pay | Admitting: Obstetrics and Gynecology

## 2024-04-27 ENCOUNTER — Ambulatory Visit: Payer: Self-pay

## 2024-04-27 DIAGNOSIS — B9689 Other specified bacterial agents as the cause of diseases classified elsewhere: Secondary | ICD-10-CM

## 2024-04-27 MED ORDER — METRONIDAZOLE 0.75 % VA GEL: 1.0000 | Freq: Every day | VAGINAL | 1 refills | Status: DC

## 2024-04-27 NOTE — Telephone Encounter (Signed)
 Pt states that she has been seen recently by OB/GYN. Missed dose of birth control yesterday, states that she is unsure if she should take two doses today, has reached out to OB/GYN without response.    Summary: rx concern   Copied From CRM [id number]. Reason for Triage: The patient would like to know if it is okay to take their birth control norethindrone  (ORTHO MICRONOR ) 0.35 MG tablet [[id number]] today, they were unable to take the medication yesterday 04/26/24 and want to ensure that this will cause no complications  Please contact the patient further if/when possible

## 2024-05-04 ENCOUNTER — Telehealth: Payer: Self-pay

## 2024-05-04 NOTE — Telephone Encounter (Signed)
 Patient called stating she is breast feeding and plans to have a few drinks this weekend. She wants to know what she should do to prevent baby from being exposed to alcohol. Per Dr. Cathyann Cobia, patient should pump before consuming alcohol and she should dump the first container of milk after consuming alcohol. Understanding was voiced. Averly Ericson l Joceline Hinchcliff, CMA

## 2024-05-19 ENCOUNTER — Ambulatory Visit: Admitting: Obstetrics and Gynecology

## 2024-05-30 ENCOUNTER — Ambulatory Visit: Admitting: Obstetrics and Gynecology

## 2024-11-10 ENCOUNTER — Ambulatory Visit

## 2024-11-10 ENCOUNTER — Other Ambulatory Visit (HOSPITAL_COMMUNITY)
Admission: RE | Admit: 2024-11-10 | Discharge: 2024-11-10 | Disposition: A | Source: Ambulatory Visit | Attending: Family Medicine | Admitting: Family Medicine

## 2024-11-10 VITALS — BP 102/67 | HR 75 | Ht 66.0 in | Wt 138.0 lb

## 2024-11-10 DIAGNOSIS — N898 Other specified noninflammatory disorders of vagina: Secondary | ICD-10-CM | POA: Diagnosis present

## 2024-11-10 NOTE — Progress Notes (Signed)
 SUBJECTIVE:  23 y.o. female who desires a STI screen. Denies abnormal vaginal discharge, bleeding or significant pelvic pain. No UTI symptoms. Denies history of known exposure to STD.  Patient's last menstrual period was 11/03/2024 (approximate).  OBJECTIVE:  She appears well.   ASSESSMENT:  STI Screen   PLAN:  Pt offered STI blood screening-declined GC, chlamydia, and trichomonas probe sent to lab.  Treatment: To be determined once lab results are received.  Pt follow up as needed.   Erminio DELENA Rumps, RN

## 2024-11-14 ENCOUNTER — Ambulatory Visit: Payer: Self-pay | Admitting: Family Medicine

## 2024-11-14 LAB — CERVICOVAGINAL ANCILLARY ONLY
Bacterial Vaginitis (gardnerella): POSITIVE — AB
Candida Glabrata: NEGATIVE
Candida Vaginitis: NEGATIVE
Chlamydia: NEGATIVE
Comment: NEGATIVE
Comment: NEGATIVE
Comment: NEGATIVE
Comment: NEGATIVE
Comment: NEGATIVE
Comment: NORMAL
Neisseria Gonorrhea: NEGATIVE
Trichomonas: NEGATIVE

## 2024-11-14 MED ORDER — METRONIDAZOLE 500 MG PO TABS: 500.0000 mg | ORAL_TABLET | Freq: Two times a day (BID) | ORAL | 0 refills | Status: DC

## 2024-11-14 MED ORDER — METRONIDAZOLE 0.75 % VA GEL: 1.0000 | Freq: Every day | VAGINAL | 1 refills | Status: AC
